# Patient Record
Sex: Male | Born: 1960 | Race: White | Hispanic: No | Smoking: Former smoker
Health system: Southern US, Community
[De-identification: ages and names within clinical notes are randomized; demographics above are authoritative.]

## PROBLEM LIST (undated history)

## (undated) DIAGNOSIS — M1990 Unspecified osteoarthritis, unspecified site: Secondary | ICD-10-CM

## (undated) DIAGNOSIS — T7840XA Allergy, unspecified, initial encounter: Secondary | ICD-10-CM

## (undated) DIAGNOSIS — F419 Anxiety disorder, unspecified: Secondary | ICD-10-CM

## (undated) DIAGNOSIS — K219 Gastro-esophageal reflux disease without esophagitis: Secondary | ICD-10-CM

## (undated) DIAGNOSIS — E785 Hyperlipidemia, unspecified: Secondary | ICD-10-CM

## (undated) DIAGNOSIS — I10 Essential (primary) hypertension: Secondary | ICD-10-CM

## (undated) HISTORY — DX: Allergy, unspecified, initial encounter: T78.40XA

## (undated) HISTORY — DX: Gastro-esophageal reflux disease without esophagitis: K21.9

## (undated) HISTORY — DX: Anxiety disorder, unspecified: F41.9

## (undated) HISTORY — DX: Essential (primary) hypertension: I10

## (undated) HISTORY — PX: OTHER SURGICAL HISTORY: SHX169

## (undated) HISTORY — DX: Unspecified osteoarthritis, unspecified site: M19.90

## (undated) HISTORY — DX: Hyperlipidemia, unspecified: E78.5

## (undated) HISTORY — PX: POLYPECTOMY: SHX149

## (undated) HISTORY — PX: COLONOSCOPY: SHX174

---

## 2000-08-06 ENCOUNTER — Encounter: Admission: RE | Admit: 2000-08-06 | Discharge: 2000-08-17 | Payer: Self-pay | Admitting: Specialist

## 2002-08-25 ENCOUNTER — Emergency Department (HOSPITAL_COMMUNITY): Admission: EM | Admit: 2002-08-25 | Discharge: 2002-08-25 | Payer: Self-pay | Admitting: Emergency Medicine

## 2003-03-21 ENCOUNTER — Emergency Department (HOSPITAL_COMMUNITY): Admission: EM | Admit: 2003-03-21 | Discharge: 2003-03-21 | Payer: Self-pay | Admitting: Emergency Medicine

## 2005-12-01 ENCOUNTER — Encounter: Admission: RE | Admit: 2005-12-01 | Discharge: 2005-12-01 | Payer: Self-pay | Admitting: Neurology

## 2010-08-12 ENCOUNTER — Encounter: Payer: Self-pay | Admitting: Gastroenterology

## 2010-08-20 ENCOUNTER — Ambulatory Visit (AMBULATORY_SURGERY_CENTER): Payer: 59 | Admitting: *Deleted

## 2010-08-20 VITALS — Ht 69.0 in | Wt 180.5 lb

## 2010-08-20 DIAGNOSIS — Z1211 Encounter for screening for malignant neoplasm of colon: Secondary | ICD-10-CM

## 2010-08-20 MED ORDER — PEG-KCL-NACL-NASULF-NA ASC-C 100 G PO SOLR: ORAL | Status: DC

## 2010-08-21 ENCOUNTER — Encounter: Payer: Self-pay | Admitting: Gastroenterology

## 2010-08-28 ENCOUNTER — Telehealth: Payer: Self-pay | Admitting: Gastroenterology

## 2010-08-28 NOTE — Telephone Encounter (Signed)
Rx for Moviprep called in to [facility] on [address].  No ID on answering machine.  No message left. Casey Stephens

## 2010-09-03 ENCOUNTER — Encounter: Payer: Self-pay | Admitting: Gastroenterology

## 2010-09-03 ENCOUNTER — Ambulatory Visit (AMBULATORY_SURGERY_CENTER): Payer: 59 | Admitting: Gastroenterology

## 2010-09-03 DIAGNOSIS — K620 Anal polyp: Secondary | ICD-10-CM

## 2010-09-03 DIAGNOSIS — D126 Benign neoplasm of colon, unspecified: Secondary | ICD-10-CM

## 2010-09-03 DIAGNOSIS — Z1211 Encounter for screening for malignant neoplasm of colon: Secondary | ICD-10-CM

## 2010-09-03 MED ORDER — SODIUM CHLORIDE 0.9 % IV SOLN: 500.0000 mL | INTRAVENOUS | Status: DC

## 2010-09-03 NOTE — Patient Instructions (Addendum)
Please read the handouts given to you by your recovery room nurse.    We will send you a letter within two weeks stating when you need to some back to see [place], and which type of polyp.    Resume your routine medications.   If you have any questions or concerns, please call us at [phone].   Thank-you.

## 2010-09-04 ENCOUNTER — Telehealth: Payer: Self-pay

## 2010-09-04 NOTE — Telephone Encounter (Signed)
No ID on answering machine. 

## 2010-09-14 ENCOUNTER — Encounter: Payer: Self-pay | Admitting: Gastroenterology

## 2012-09-20 ENCOUNTER — Other Ambulatory Visit: Payer: Self-pay | Admitting: Physician Assistant

## 2012-09-29 ENCOUNTER — Other Ambulatory Visit: Payer: BC Managed Care – PPO

## 2012-09-29 DIAGNOSIS — D751 Secondary polycythemia: Secondary | ICD-10-CM

## 2012-09-29 DIAGNOSIS — E291 Testicular hypofunction: Secondary | ICD-10-CM

## 2012-09-29 MED ORDER — TESTOSTERONE CYPIONATE 200 MG/ML IM SOLN: 100.0000 mg | INTRAMUSCULAR | Status: DC

## 2012-09-30 LAB — CBC WITH DIFFERENTIAL/PLATELET
Basophils Relative: 1 % (ref 0–1)
Eosinophils Absolute: 0.2 10*3/uL (ref 0.0–0.7)
Eosinophils Relative: 3 % (ref 0–5)
HCT: 49.6 % (ref 39.0–52.0)
Hemoglobin: 16.9 g/dL (ref 13.0–17.0)
Lymphocytes Relative: 36 % (ref 12–46)
Lymphs Abs: 2.6 10*3/uL (ref 0.7–4.0)
MCH: 31.2 pg (ref 26.0–34.0)
MCHC: 34.1 g/dL (ref 30.0–36.0)
MCV: 91.7 fL (ref 78.0–100.0)
Monocytes Absolute: 0.8 10*3/uL (ref 0.1–1.0)
Monocytes Relative: 11 % (ref 3–12)
Neutro Abs: 3.5 10*3/uL (ref 1.7–7.7)
Platelets: 241 10*3/uL (ref 150–400)
RBC: 5.41 MIL/uL (ref 4.22–5.81)
RDW: 13.7 % (ref 11.5–15.5)
WBC: 7.2 10*3/uL (ref 4.0–10.5)

## 2012-09-30 LAB — PSA: PSA: 0.99 ng/mL (ref <=4.00)

## 2012-10-04 ENCOUNTER — Encounter: Payer: Self-pay | Admitting: Family Medicine

## 2012-10-04 ENCOUNTER — Ambulatory Visit (INDEPENDENT_AMBULATORY_CARE_PROVIDER_SITE_OTHER): Payer: BC Managed Care – PPO | Admitting: Family Medicine

## 2012-10-04 ENCOUNTER — Telehealth: Payer: Self-pay | Admitting: Family Medicine

## 2012-10-04 VITALS — BP 120/88 | HR 78 | Temp 98.1°F | Resp 16 | Wt 180.0 lb

## 2012-10-04 DIAGNOSIS — I770 Arteriovenous fistula, acquired: Secondary | ICD-10-CM

## 2012-10-04 DIAGNOSIS — R519 Headache, unspecified: Secondary | ICD-10-CM

## 2012-10-04 DIAGNOSIS — R51 Headache: Secondary | ICD-10-CM

## 2012-10-04 MED ORDER — EPINEPHRINE 0.3 MG/0.3ML IJ SOAJ: 0.3000 mg | Freq: Once | INTRAMUSCULAR | Status: DC

## 2012-10-04 NOTE — Progress Notes (Signed)
Subjective:    Patient ID: Casey Stephens, male    DOB: 1960/05/07, 52 y.o.   MRN: [id number]  HPI Patient suffered a traumatic injury to his left temple in 1999. He was hit in the head with a softball. 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 He underwent a coil procedure due to chronic headaches that this triggered. The headaches are pulsatile in nature unilateral and associated with nausea. The headache subsided after the procedure. Over the last month he started developing the same headaches again. They're constant and daily. Worse at night. They're pulsatile in nature. They're located over the left temporal artery. He also has some mild blurred vision. He has some nausea. He denies photophobia. He denies photophobia. He denies worsening of the headache with Valsalva. He denies neurologic deficits. Past Medical History  Diagnosis Date  . Allergy    Past Surgical History  Procedure Laterality Date  . Av fistula lt temple      ruptured artery from softball injury  . Crushed thumb repair    . Testicular biopsy      negative   Current Outpatient Prescriptions on File Prior to Visit  Medication Sig Dispense Refill  . aspirin 81 MG tablet Take 81 mg by mouth daily.        . fish oil-omega-3 fatty acids 1000 MG capsule Take 2 g by mouth daily.         No current facility-administered medications on file prior to visit.   No Known Allergies History   Social History  . Marital Status: Married    Spouse Name: N/A    Number of Children: N/A  . Years of Education: N/A   Occupational History  . Not on file.   Social History Main Topics  . Smoking status: Current Every Day Smoker -- 0.50 packs/day for 25 years    Types: Cigarettes  . Smokeless tobacco: Never Used  . Alcohol Use: 1.2 oz/week    2 Cans of beer per week  . Drug Use: No  . Sexually Active: Not on file   Other Topics Concern  . Not on file   Social History Narrative   . No narrative on file   Family history significant for a sister with migraines.   Review of Systems  All other systems reviewed and are negative.       Objective:   Physical Exam  Constitutional: He appears well-developed and well-nourished. No distress.  Eyes: Conjunctivae and EOM are normal. Pupils are equal, round, and reactive to light.  Neck: Neck supple. No JVD present.  Cardiovascular: Normal rate, regular rhythm and normal heart sounds.   Pulmonary/Chest: Effort normal and breath sounds normal. No respiratory distress. He has no wheezes.  Lymphadenopathy:    He has no cervical adenopathy.  Neurological: He is alert. He has normal strength and normal reflexes. He displays no atrophy and no tremor. No cranial nerve deficit or sensory deficit. He exhibits normal muscle tone. He displays a negative Romberg sign. He displays no seizure activity. Coordination and gait normal.  Skin: He is not diaphoretic.          Assessment & Plan:  Unilateral headache - Plan: MR Angiogram Head Wo Contrast  AV fistula - Plan: MR Angiogram Head Wo Contrast  Evaluate previous AV fistula repair with an MRA of the brain. If this shows worsening of the fistula or new fistula the patient may need neurosurgery consultation. However the  patient may be having atypical migraines related to his previous fistula repair. If MRA is negative, I would consider starting patient on Topamax to treat chronic daily migraines.  I believe his age and sex make temporal arteritis unlikely.

## 2012-10-05 NOTE — Telephone Encounter (Signed)
Patient aware of labs results 

## 2012-10-08 ENCOUNTER — Other Ambulatory Visit: Payer: 59

## 2012-10-10 ENCOUNTER — Ambulatory Visit
Admission: RE | Admit: 2012-10-10 | Discharge: 2012-10-10 | Disposition: A | Discharge status: Home / Self Care | Payer: BC Managed Care – PPO | Source: Ambulatory Visit | Attending: Family Medicine | Admitting: Family Medicine

## 2012-10-10 DIAGNOSIS — I770 Arteriovenous fistula, acquired: Secondary | ICD-10-CM

## 2012-10-10 DIAGNOSIS — R519 Headache, unspecified: Secondary | ICD-10-CM

## 2012-10-13 ENCOUNTER — Other Ambulatory Visit: Payer: Self-pay | Admitting: Family Medicine

## 2012-10-13 MED ORDER — SUMATRIPTAN SUCCINATE 50 MG PO TABS: ORAL_TABLET | ORAL | Status: DC

## 2012-10-13 NOTE — Telephone Encounter (Signed)
Med sent to pharmacy.

## 2012-10-27 ENCOUNTER — Ambulatory Visit (INDEPENDENT_AMBULATORY_CARE_PROVIDER_SITE_OTHER): Payer: BC Managed Care – PPO | Admitting: Family Medicine

## 2012-10-27 ENCOUNTER — Encounter: Payer: Self-pay | Admitting: Family Medicine

## 2012-10-27 VITALS — BP 130/80 | HR 78 | Temp 98.4°F | Resp 16 | Wt 183.0 lb

## 2012-10-27 DIAGNOSIS — R519 Headache, unspecified: Secondary | ICD-10-CM

## 2012-10-27 DIAGNOSIS — R51 Headache: Secondary | ICD-10-CM

## 2012-10-27 NOTE — Progress Notes (Signed)
Subjective:    Patient ID: Casey Stephens, male    DOB: Mar 28, 1960, 52 y.o.   MRN: [id number]  HPI  10/04/12 Patient suffered a traumatic injury to his left temple in 1999. He was hit in the head with a softball. 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 He underwent a coil procedure due to chronic headaches that this triggered. The headaches are pulsatile in nature unilateral and associated with nausea. The headache subsided after the procedure. Over the last month he started developing the same headaches again. They're constant and daily. Worse at night. They're pulsatile in nature. They're located over the left temporal artery. He also has some mild blurred vision. He has some nausea. He denies photophobia. He denies photophobia. He denies worsening of the headache with Valsalva. He denies neurologic deficits. Unilateral headache Evaluate previous AV fistula repair with an MRA of the brain. If this shows worsening of the fistula or new fistula the patient may need neurosurgery consultation. However the patient may be having atypical migraines related to his previous fistula repair. If MRA is negative, I would consider starting patient on Topamax to treat chronic daily migraines.  I believe his age and sex make temporal arteritis unlikely.  10/27/12 MRA of the patient's brain was negative. There was no evidence of aneurysm were AV fistula. Prescribed the patient Imitrex to treat migraines and this has helped his headache. Unfortunately he continues to have headaches on a daily basis. They continue to be unilateral over the left side of his head. They continue to be pulsatile and associated with nausea. Past Medical History  Diagnosis Date  . Allergy    Past Surgical History  Procedure Laterality Date  . Av fistula lt temple      ruptured artery from softball injury  . Crushed thumb repair    . Testicular biopsy      negative   Current Outpatient  Prescriptions on File Prior to Visit  Medication Sig Dispense Refill  . aspirin 81 MG tablet Take 81 mg by mouth daily.        Marland Kitchen EPINEPHrine (EPI-PEN) 0.3 mg/0.3 mL SOAJ Inject 0.3 mLs (0.3 mg total) into the muscle once.  1 Device  5  . fish oil-omega-3 fatty acids 1000 MG capsule Take 2 g by mouth daily.        . SUMAtriptan (IMITREX) 50 MG tablet Take 1 tab po at onset of headache and repeat in 2 hours PRN  10 tablet  2   No current facility-administered medications on file prior to visit.   No Known Allergies History   Social History  . Marital Status: Married    Spouse Name: N/A    Number of Children: N/A  . Years of Education: N/A   Occupational History  . Not on file.   Social History Main Topics  . Smoking status: Current Every Day Smoker -- 0.50 packs/day for 25 years    Types: Cigarettes  . Smokeless tobacco: Never Used  . Alcohol Use: 1.2 oz/week    2 Cans of beer per week  . Drug Use: No  . Sexual Activity: Not on file   Other Topics Concern  . Not on file   Social History Narrative  . No narrative on file   Family history significant for a sister with migraines.   Review of Systems  All other systems reviewed and are negative.       Objective:  Physical Exam  Constitutional: He appears well-developed and well-nourished. No distress.  Eyes: Conjunctivae and EOM are normal. Pupils are equal, round, and reactive to light.  Neck: Neck supple. No JVD present.  Cardiovascular: Normal rate, regular rhythm and normal heart sounds.   Pulmonary/Chest: Effort normal and breath sounds normal. No respiratory distress. He has no wheezes.  Lymphadenopathy:    He has no cervical adenopathy.  Neurological: He is alert. He has normal strength and normal reflexes. He displays no atrophy and no tremor. No cranial nerve deficit or sensory deficit. He exhibits normal muscle tone. He displays a negative Romberg sign. He displays no seizure activity. Coordination and gait  normal.  Skin: He is not diaphoretic.          Assessment & Plan:  Unilateral headache  I believe the patient's headaches are likely migraines although they're atypical in nature. I spent 15 minutes discussing treatment options with the patient. Given the fact they're occurring on a daily basis, I recommended Topamax 25 mg by mouth each bedtime. I recommended increasing medication in  25 mg increments each week until he is taking a total of 100 mg per day. I would then recheck him in one month. The patient would like to consider this first prior to starting medication. He stated he would call me with his decision in the next few days.

## 2012-12-05 ENCOUNTER — Telehealth: Payer: Self-pay | Admitting: Family Medicine

## 2012-12-05 MED ORDER — TESTOSTERONE CYPIONATE 200 MG/ML IM SOLN: 100.0000 mg | INTRAMUSCULAR | Status: DC

## 2012-12-05 NOTE — Telephone Encounter (Signed)
Testosterone Cyp 2000 mg/10 mL inject 1/2 mL q2 weeks as directed

## 2012-12-05 NOTE — Telephone Encounter (Signed)
rx was printed and faxed to pharmacy 

## 2013-10-01 ENCOUNTER — Other Ambulatory Visit: Payer: Self-pay | Admitting: Family Medicine

## 2013-10-02 NOTE — Telephone Encounter (Signed)
ok 

## 2013-10-02 NOTE — Telephone Encounter (Signed)
Ok to refill??  Last office visit 10/27/2012.  Last refill 12/05/2012.

## 2013-10-02 NOTE — Telephone Encounter (Signed)
Prescription faxed

## 2013-10-16 ENCOUNTER — Other Ambulatory Visit: Payer: Self-pay | Admitting: Physician Assistant

## 2013-10-17 NOTE — Telephone Encounter (Signed)
Refill appropriate and filled per protocol. 

## 2013-12-19 ENCOUNTER — Other Ambulatory Visit: Payer: 59

## 2013-12-19 DIAGNOSIS — Z0000 Encounter for general adult medical examination without abnormal findings: Secondary | ICD-10-CM

## 2013-12-19 LAB — COMPLETE METABOLIC PANEL WITH GFR
ALT: 30 U/L (ref 0–53)
AST: 19 U/L (ref 0–37)
Albumin: 4.3 g/dL (ref 3.5–5.2)
Alkaline Phosphatase: 57 U/L (ref 39–117)
BUN: 12 mg/dL (ref 6–23)
CO2: 23 mEq/L (ref 19–32)
Calcium: 9.5 mg/dL (ref 8.4–10.5)
Chloride: 107 mEq/L (ref 96–112)
Creat: 1.09 mg/dL (ref 0.50–1.35)
GFR, Est African American: 89 mL/min
GFR, Est Non African American: 77 mL/min
Glucose, Bld: 82 mg/dL (ref 70–99)
Potassium: 4.1 mEq/L (ref 3.5–5.3)
Sodium: 139 mEq/L (ref 135–145)
Total Bilirubin: 1.1 mg/dL (ref 0.2–1.2)
Total Protein: 6.6 g/dL (ref 6.0–8.3)

## 2013-12-19 LAB — CBC WITH DIFFERENTIAL/PLATELET
Basophils Absolute: 0.1 10*3/uL (ref 0.0–0.1)
Basophils Relative: 1 % (ref 0–1)
Eosinophils Absolute: 0.2 10*3/uL (ref 0.0–0.7)
Eosinophils Relative: 2 % (ref 0–5)
HCT: 47.3 % (ref 39.0–52.0)
Hemoglobin: 16.4 g/dL (ref 13.0–17.0)
Lymphocytes Relative: 35 % (ref 12–46)
Lymphs Abs: 2.7 10*3/uL (ref 0.7–4.0)
MCH: 30.4 pg (ref 26.0–34.0)
MCHC: 34.7 g/dL (ref 30.0–36.0)
MCV: 87.6 fL (ref 78.0–100.0)
Monocytes Absolute: 0.9 10*3/uL (ref 0.1–1.0)
Monocytes Relative: 12 % (ref 3–12)
Neutro Abs: 3.8 10*3/uL (ref 1.7–7.7)
Neutrophils Relative %: 50 % (ref 43–77)
Platelets: 213 10*3/uL (ref 150–400)
RBC: 5.4 MIL/uL (ref 4.22–5.81)
RDW: 13.5 % (ref 11.5–15.5)
WBC: 7.6 10*3/uL (ref 4.0–10.5)

## 2013-12-19 LAB — LIPID PANEL
Cholesterol: 154 mg/dL (ref 0–200)
HDL: 46 mg/dL (ref >39)
LDL Cholesterol: 73 mg/dL (ref 0–99)
Total CHOL/HDL Ratio: 3.3 Ratio
Triglycerides: 174 mg/dL — ABNORMAL HIGH (ref <150)
VLDL: 35 mg/dL (ref 0–40)

## 2013-12-26 ENCOUNTER — Ambulatory Visit
Admission: RE | Admit: 2013-12-26 | Discharge: 2013-12-26 | Disposition: A | Discharge status: Home / Self Care | Payer: 59 | Source: Ambulatory Visit | Attending: Family Medicine | Admitting: Family Medicine

## 2013-12-26 ENCOUNTER — Encounter: Payer: Self-pay | Admitting: Family Medicine

## 2013-12-26 ENCOUNTER — Ambulatory Visit (INDEPENDENT_AMBULATORY_CARE_PROVIDER_SITE_OTHER): Payer: 59 | Admitting: Family Medicine

## 2013-12-26 VITALS — BP 122/80 | HR 78 | Temp 97.8°F | Resp 18 | Ht 69.0 in | Wt 197.0 lb

## 2013-12-26 DIAGNOSIS — Z23 Encounter for immunization: Secondary | ICD-10-CM

## 2013-12-26 DIAGNOSIS — M5136 Other intervertebral disc degeneration, lumbar region: Secondary | ICD-10-CM

## 2013-12-26 DIAGNOSIS — Z0000 Encounter for general adult medical examination without abnormal findings: Secondary | ICD-10-CM

## 2013-12-26 DIAGNOSIS — Z125 Encounter for screening for malignant neoplasm of prostate: Secondary | ICD-10-CM

## 2013-12-26 NOTE — Addendum Note (Signed)
Addended by: Jenna Luo on: 12/26/2013 12:22 PM   Modules accepted: Orders

## 2013-12-26 NOTE — Addendum Note (Signed)
Addended by: Shary Decamp B on: 12/26/2013 02:29 PM   Modules accepted: Orders

## 2013-12-26 NOTE — Progress Notes (Signed)
Subjective:    Patient ID: Casey Stephens, male    DOB: [birth year], 53 y.o.   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 Recently complains of increasing left-sided lower back pain. He denies any lumbar radiculopathy. He denies any numbness or tingling in his legs or feet. He denies any symptoms of cauda equina syndrome. He does report crepitus in his back with range of motion. He has constant dull daily pain which is aggravated by prolonged standing or walking. He's tried ibuprofen with minimal relief. He has also tried heat andTENS unit with minimal relief. Patient's colonoscopy was performed 2 years ago and is up to date. He is due for rectal exam and PSA. He is also due for his flu shot. His most recent lab work is listed below: Lab on 12/19/2013  Component Date Value Ref Range Status  . Cholesterol 12/19/2013 154  0 - 200 mg/dL Final   Comment: ATP III Classification:                                < 200        mg/dL        Desirable                               200 - 239     mg/dL        Borderline High                               >= 240        mg/dL        High                             . Triglycerides 12/19/2013 174* <150 mg/dL Final  . HDL 12/19/2013 46  >39 mg/dL Final  . Total CHOL/HDL Ratio 12/19/2013 3.3   Final  . VLDL 12/19/2013 35  0 - 40 mg/dL Final  . LDL Cholesterol 12/19/2013 73  0 - 99 mg/dL Final   Comment:                            Total Cholesterol/HDL Ratio:CHD Risk                                                 Coronary Heart Disease Risk Table                                                                 Men       Women                                   1/2 Average Risk  3.4        3.3                                       Average Risk              5.0        4.4                                    2X Average Risk              9.6        7.1                                    3X Average Risk              23.4       11.0                          Use the calculated Patient Ratio above and the CHD Risk table                           to determine the patient's CHD Risk.                          ATP III Classification (LDL):                                < 100        mg/dL         Optimal                               100 - 129     mg/dL         Near or Above Optimal                               130 - 159     mg/dL         Borderline High                               160 - 189     mg/dL         High                                > 190        mg/dL         Very High                             . WBC 12/19/2013 7.6  4.0 - 10.5 K/uL Final  . RBC 12/19/2013 5.40  4.22 - 5.81 MIL/uL Final  . Hemoglobin 12/19/2013 16.4  13.0 - 17.0 g/dL Final  . HCT 12/19/2013 47.3  39.0 -  52.0 % Final  . MCV 12/19/2013 87.6  78.0 - 100.0 fL Final  . MCH 12/19/2013 30.4  26.0 - 34.0 pg Final  . MCHC 12/19/2013 34.7  30.0 - 36.0 g/dL Final  . RDW 12/19/2013 13.5  11.5 - 15.5 % Final  . Platelets 12/19/2013 213  150 - 400 K/uL Final  . Neutrophils Relative % 12/19/2013 50  43 - 77 % Final  . Neutro Abs 12/19/2013 3.8  1.7 - 7.7 K/uL Final  . Lymphocytes Relative 12/19/2013 35  12 - 46 % Final  . Lymphs Abs 12/19/2013 2.7  0.7 - 4.0 K/uL Final  . Monocytes Relative 12/19/2013 12  3 - 12 % Final  . Monocytes Absolute 12/19/2013 0.9  0.1 - 1.0 K/uL Final  . Eosinophils Relative 12/19/2013 2  0 - 5 % Final  . Eosinophils Absolute 12/19/2013 0.2  0.0 - 0.7 K/uL Final  . Basophils Relative 12/19/2013 1  0 - 1 % Final  . Basophils Absolute 12/19/2013 0.1  0.0 - 0.1 K/uL Final  . Smear Review 12/19/2013 Criteria for review not met   Final  . Sodium 12/19/2013 139  135 - 145 mEq/L Final  . Potassium 12/19/2013 4.1  3.5 - 5.3 mEq/L Final  . Chloride 12/19/2013 107  96 - 112 mEq/L Final  . CO2 12/19/2013 23  19 - 32 mEq/L Final  . Glucose, Bld 12/19/2013 82  70 - 99 mg/dL Final  . BUN 12/19/2013 12  6 -  23 mg/dL Final  . Creat 12/19/2013 1.09  0.50 - 1.35 mg/dL Final  . Total Bilirubin 12/19/2013 1.1  0.2 - 1.2 mg/dL Final  . Alkaline Phosphatase 12/19/2013 57  39 - 117 U/L Final  . AST 12/19/2013 19  0 - 37 U/L Final  . ALT 12/19/2013 30  0 - 53 U/L Final  . Total Protein 12/19/2013 6.6  6.0 - 8.3 g/dL Final  . Albumin 12/19/2013 4.3  3.5 - 5.2 g/dL Final  . Calcium 12/19/2013 9.5  8.4 - 10.5 mg/dL Final  . GFR, Est African American 12/19/2013 89   Final  . GFR, Est Non African American 12/19/2013 77   Final   Comment:                            The estimated GFR is a calculation valid for adults (>=49 years old)                          that uses the CKD-EPI algorithm to adjust for age and sex. It is                            not to be used for children, pregnant women, hospitalized patients,                             patients on dialysis, or with rapidly changing kidney function.                          According to the NKDEP, eGFR >89 is normal, 60-89 shows mild                          impairment, 30-59 shows moderate impairment, 15-29 shows severe  impairment and <15 is ESRD.                              Past Medical History  Diagnosis Date  . Allergy    Past Surgical History  Procedure Laterality Date  . Av fistula lt temple      ruptured artery from softball injury  . Crushed thumb repair    . Testicular biopsy      negative   Current Outpatient Prescriptions on File Prior to Visit  Medication Sig Dispense Refill  . aspirin 81 MG tablet Take 81 mg by mouth daily.        . B-D 3CC LUER-LOK SYR 23GX1" 23G X 1" 3 ML MISC USE TO INJECT TESTOSTERONE EVERY 2 WEEKS  6 each  3  . EPINEPHrine (EPI-PEN) 0.3 mg/0.3 mL SOAJ Inject 0.3 mLs (0.3 mg total) into the muscle once.  1 Device  5  . fish oil-omega-3 fatty acids 1000 MG capsule Take 2 g by mouth daily.        . SUMAtriptan (IMITREX) 50 MG tablet Take 1 tab po at onset of headache and repeat in 2  hours PRN  10 tablet  2  . testosterone cypionate (DEPOTESTOTERONE CYPIONATE) 200 MG/ML injection INJECT 1/2ML INTO MUSCLE EVERY 2 WEEKS AS DIRECTED  10 mL  0   No current facility-administered medications on file prior to visit.   No Known Allergies History   Social History  . Marital Status: Married    Spouse Name: N/A    Number of Children: N/A  . Years of Education: N/A   Occupational History  . Not on file.   Social History Main Topics  . Smoking status: Current Every Day Smoker -- 0.50 packs/day for 25 years    Types: Cigarettes  . Smokeless tobacco: Never Used  . Alcohol Use: 1.2 oz/week    2 Cans of beer per week  . Drug Use: No  . Sexual Activity: Not on file   Other Topics Concern  . Not on file   Social History Narrative  . No narrative on file   Family History  Problem Relation Age of Onset  . Diabetes Father       Review of Systems  All other systems reviewed and are negative.      Objective:   Physical Exam  Vitals reviewed. Constitutional: He is oriented to person, place, and time. He appears well-developed and well-nourished. No distress.  HENT:  Head: Normocephalic and atraumatic.  Right Ear: External ear normal.  Left Ear: External ear normal.  Nose: Nose normal.  Mouth/Throat: Oropharynx is clear and moist. No oropharyngeal exudate.  Eyes: Conjunctivae and EOM are normal. Pupils are equal, round, and reactive to light. Right eye exhibits no discharge. Left eye exhibits no discharge. No scleral icterus.  Neck: Normal range of motion. Neck supple. No JVD present. No tracheal deviation present. No thyromegaly present.  Cardiovascular: Normal rate, regular rhythm, normal heart sounds and intact distal pulses.  Exam reveals no gallop and no friction rub.   No murmur heard. Pulmonary/Chest: Effort normal and breath sounds normal. No stridor. No respiratory distress. He has no wheezes. He has no rales. He exhibits no tenderness.  Abdominal: Soft.  Bowel sounds are normal. He exhibits no distension and no mass. There is no tenderness. There is no rebound and no guarding.  Genitourinary: Rectum normal and prostate normal.  Musculoskeletal: Normal range of motion.  He exhibits no edema and no tenderness.  Lymphadenopathy:    He has no cervical adenopathy.  Neurological: He is alert and oriented to person, place, and time. He has normal reflexes. He displays normal reflexes. No cranial nerve deficit. He exhibits normal muscle tone. Coordination normal.  Skin: Skin is warm. No rash noted. He is not diaphoretic. No erythema. No pallor.  Psychiatric: He has a normal mood and affect. His behavior is normal. Judgment and thought content normal.          Assessment & Plan:  Degenerative disc disease, lumbar - Plan: DG Lumbar Spine Complete  Special screening for malignant neoplasm of prostate  Routine general medical examination at a health care facility  Patient's physical exam is completely normal. His lab work is excellent. His cancer screening is up to date. His prostate exam is normal. I will add a PSA to his lab work. His blood pressure is excellent. 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 I will obtain an x-ray of the lumbar spine to evaluate further. I would likely recommend meloxicam or diclofenac as well as physical therapy for his low back pain is no better I would recommend referral back to his orthopedist Dr. Louanne Skye.

## 2013-12-27 ENCOUNTER — Other Ambulatory Visit: Payer: Self-pay | Admitting: Family Medicine

## 2013-12-27 DIAGNOSIS — M549 Dorsalgia, unspecified: Principal | ICD-10-CM

## 2013-12-27 DIAGNOSIS — G8929 Other chronic pain: Secondary | ICD-10-CM

## 2013-12-27 LAB — PSA: PSA: 0.77 ng/mL (ref <=4.00)

## 2013-12-27 MED ORDER — DICLOFENAC SODIUM 75 MG PO TBEC: 75.0000 mg | DELAYED_RELEASE_TABLET | Freq: Two times a day (BID) | ORAL | Status: DC

## 2014-01-02 ENCOUNTER — Ambulatory Visit: Payer: 59 | Attending: Family Medicine | Admitting: Physical Therapy

## 2014-01-02 DIAGNOSIS — Z5189 Encounter for other specified aftercare: Secondary | ICD-10-CM | POA: Insufficient documentation

## 2014-01-02 DIAGNOSIS — M5136 Other intervertebral disc degeneration, lumbar region: Secondary | ICD-10-CM | POA: Diagnosis not present

## 2014-01-02 DIAGNOSIS — M549 Dorsalgia, unspecified: Secondary | ICD-10-CM | POA: Diagnosis not present

## 2014-01-29 ENCOUNTER — Ambulatory Visit (INDEPENDENT_AMBULATORY_CARE_PROVIDER_SITE_OTHER): Payer: 59 | Admitting: Family Medicine

## 2014-01-29 ENCOUNTER — Encounter: Payer: Self-pay | Admitting: Family Medicine

## 2014-01-29 VITALS — BP 120/74 | HR 68 | Temp 98.2°F | Resp 16 | Ht 69.0 in | Wt 194.0 lb

## 2014-01-29 DIAGNOSIS — M5136 Other intervertebral disc degeneration, lumbar region: Secondary | ICD-10-CM

## 2014-01-29 MED ORDER — DICLOFENAC SODIUM 75 MG PO TBEC: 75.0000 mg | DELAYED_RELEASE_TABLET | Freq: Two times a day (BID) | ORAL | Status: DC

## 2014-01-29 NOTE — Progress Notes (Signed)
   Subjective:    Patient ID: Casey Stephens, male    DOB: October 21, 1960, 53 y.o.   MRN: [id number]  HPI Please see the patient's last office visit. At that time, the patient was complaining about back pain. I obtained an x-ray of his lumbar spine which revealed degenerative disc disease at L4-L5, and L5-S1. The patient also has facet disease at L3-S1. I started the patient on diclofenac 75 mg by mouth twice a day. His pain has improved over 90%. He is doing much better. He denies any symptoms of cauda equina syndrome or lumbar radiculopathy. Past Medical History  Diagnosis Date  . Allergy    Past Surgical History  Procedure Laterality Date  . Av fistula lt temple      ruptured artery from softball injury  . Crushed thumb repair    . Testicular biopsy      negative   Current Outpatient Prescriptions on File Prior to Visit  Medication Sig Dispense Refill  . aspirin 81 MG tablet Take 81 mg by mouth daily.      . B-D 3CC LUER-LOK SYR 23GX1" 23G X 1" 3 ML MISC USE TO INJECT TESTOSTERONE EVERY 2 WEEKS 6 each 3  . EPINEPHrine (EPI-PEN) 0.3 mg/0.3 mL SOAJ Inject 0.3 mLs (0.3 mg total) into the muscle once. 1 Device 5  . fish oil-omega-3 fatty acids 1000 MG capsule Take 2 g by mouth daily.      . SUMAtriptan (IMITREX) 50 MG tablet Take 1 tab po at onset of headache and repeat in 2 hours PRN 10 tablet 2  . testosterone cypionate (DEPOTESTOTERONE CYPIONATE) 200 MG/ML injection INJECT 1/2ML INTO MUSCLE EVERY 2 WEEKS AS DIRECTED 10 mL 0   No current facility-administered medications on file prior to visit.   No Known Allergies History   Social History  . Marital Status: Married    Spouse Name: N/A    Number of Children: N/A  . Years of Education: N/A   Occupational History  . Not on file.   Social History Main Topics  . Smoking status: Former Smoker -- 0.50 packs/day for 25 years    Types: Cigarettes    Quit date: 01/29/2013  . Smokeless tobacco: Never Used  . Alcohol Use: 1.2 oz/week     2 Cans of beer per week  . Drug Use: No  . Sexual Activity: Not on file   Other Topics Concern  . Not on file   Social History Narrative      Review of Systems  All other systems reviewed and are negative.      Objective:   Physical Exam  Cardiovascular: Normal rate and regular rhythm.   Pulmonary/Chest: Effort normal and breath sounds normal.  Musculoskeletal: Normal range of motion. He exhibits no tenderness.  Vitals reviewed.         Assessment & Plan:  DDD (degenerative disc disease), lumbar  Patient's pain in his lower back is due to a combination of degenerative disc disease as well as facet arthritis. I recommended that he stop taking diclofenac now the symptoms are better. I warned the patient about potential GI toxicity after prolonged use of NSAIDs. I recommended that he try to use the medication sparingly as needed for symptom control. If necessary to take everyday, I would recommend switching the patient to Celebrex.

## 2014-05-28 ENCOUNTER — Encounter: Payer: Self-pay | Admitting: Family Medicine

## 2014-05-28 ENCOUNTER — Ambulatory Visit (INDEPENDENT_AMBULATORY_CARE_PROVIDER_SITE_OTHER): Payer: 59 | Admitting: Family Medicine

## 2014-05-28 VITALS — BP 136/90 | HR 84 | Temp 97.7°F | Resp 18 | Ht 69.0 in | Wt 191.0 lb

## 2014-05-28 DIAGNOSIS — J309 Allergic rhinitis, unspecified: Secondary | ICD-10-CM | POA: Diagnosis not present

## 2014-05-28 MED ORDER — FLUTICASONE PROPIONATE 50 MCG/ACT NA SUSP: 2.0000 | Freq: Every day | NASAL | Status: DC

## 2014-05-28 MED ORDER — METHYLPREDNISOLONE ACETATE 40 MG/ML IJ SUSP
60.0000 mg | Freq: Once | INTRAMUSCULAR | Status: AC
  Administered 2014-05-28: 60 mg via INTRAMUSCULAR

## 2014-05-28 NOTE — Addendum Note (Signed)
Addended by: Shary Decamp B on: 05/28/2014 12:56 PM   Modules accepted: Orders

## 2014-05-28 NOTE — Progress Notes (Signed)
   Subjective:    Patient ID: Casey Stephens, male    DOB: 1960/09/13, 54 y.o.   MRN: [id number]  HPI Patient presents with one-week history of itchy watery eyes, rhinorrhea, head congestion, sinus pressure, and slight cough. He denies any fevers or chills. He denies any sinus headache. He denies any vision changes. He denies any shortness of breath. The cough is nonproductive. Past Medical History  Diagnosis Date  . Allergy    Past Surgical History  Procedure Laterality Date  . Av fistula lt temple      ruptured artery from softball injury  . Crushed thumb repair    . Testicular biopsy      negative   Current Outpatient Prescriptions on File Prior to Visit  Medication Sig Dispense Refill  . aspirin 81 MG tablet Take 81 mg by mouth daily.      . B-D 3CC LUER-LOK SYR 23GX1" 23G X 1" 3 ML MISC USE TO INJECT TESTOSTERONE EVERY 2 WEEKS 6 each 3  . diclofenac (VOLTAREN) 75 MG EC tablet Take 1 tablet (75 mg total) by mouth 2 (two) times daily. 60 tablet 2  . EPINEPHrine (EPI-PEN) 0.3 mg/0.3 mL SOAJ Inject 0.3 mLs (0.3 mg total) into the muscle once. 1 Device 5  . fish oil-omega-3 fatty acids 1000 MG capsule Take 2 g by mouth daily.      . SUMAtriptan (IMITREX) 50 MG tablet Take 1 tab po at onset of headache and repeat in 2 hours PRN 10 tablet 2  . testosterone cypionate (DEPOTESTOTERONE CYPIONATE) 200 MG/ML injection INJECT 1/2ML INTO MUSCLE EVERY 2 WEEKS AS DIRECTED 10 mL 0   No current facility-administered medications on file prior to visit.   No Known Allergies History   Social History  . Marital Status: Married    Spouse Name: N/A  . Number of Children: N/A  . Years of Education: N/A   Occupational History  . Not on file.   Social History Main Topics  . Smoking status: Former Smoker -- 0.50 packs/day for 25 years    Types: Cigarettes    Quit date: 01/29/2013  . Smokeless tobacco: Never Used  . Alcohol Use: 1.2 oz/week    2 Cans of beer per week  . Drug Use: No  .  Sexual Activity: Not on file   Other Topics Concern  . Not on file   Social History Narrative      Review of Systems  All other systems reviewed and are negative.      Objective:   Physical Exam  Constitutional: He appears well-developed and well-nourished.  HENT:  Right Ear: Tympanic membrane, external ear and ear canal normal.  Left Ear: Tympanic membrane, external ear and ear canal normal.  Nose: Mucosal edema and rhinorrhea present. Right sinus exhibits no maxillary sinus tenderness and no frontal sinus tenderness. Left sinus exhibits no maxillary sinus tenderness and no frontal sinus tenderness.  Mouth/Throat: Oropharynx is clear and moist. No oropharyngeal exudate.  Vitals reviewed.  patient has bilateral allergic conjunctivitis.        Assessment & Plan:  Allergic sinusitis - Plan: fluticasone (FLONASE) 50 MCG/ACT nasal spray  I believe the patient has severe seasonal allergies. He is already on Allegra. I recommended he add Flonase 2 sprays each nostril daily. I will also treat the patient with Depo-Medrol 60 mg IM 1 to help calm down his allergies until the Flonase can take affect

## 2014-05-30 ENCOUNTER — Telehealth: Payer: Self-pay | Admitting: Family Medicine

## 2014-05-30 NOTE — Telephone Encounter (Signed)
[phone] PT saw you earlier this week and his wife has called saying he is not any better and is wanting to know if there could be something else done? I made an apt for 05/31/14 but the wife is wanting to know if something could be done without him having to come back in and pay his copay since he was just here for this issue.

## 2014-05-31 ENCOUNTER — Ambulatory Visit: Payer: 59 | Admitting: Family Medicine

## 2014-05-31 MED ORDER — AMOXICILLIN-POT CLAVULANATE 875-125 MG PO TABS: 1.0000 | ORAL_TABLET | Freq: Two times a day (BID) | ORAL | Status: DC

## 2014-05-31 NOTE — Telephone Encounter (Signed)
Called and spoke with wife.  Aware of Rx for Augmentin, cont nasal spray.  We will cancel appt for today but will need to see if not better after this course of treatment

## 2014-05-31 NOTE — Telephone Encounter (Signed)
Add augmentin 875 bid for 10 days and continue flonase.

## 2014-08-10 ENCOUNTER — Ambulatory Visit (INDEPENDENT_AMBULATORY_CARE_PROVIDER_SITE_OTHER): Payer: 59 | Admitting: Family Medicine

## 2014-08-10 ENCOUNTER — Encounter: Payer: Self-pay | Admitting: Family Medicine

## 2014-08-10 VITALS — BP 134/80 | HR 82 | Temp 98.0°F | Resp 18 | Wt 192.0 lb

## 2014-08-10 DIAGNOSIS — M545 Low back pain, unspecified: Secondary | ICD-10-CM

## 2014-08-10 MED ORDER — CYCLOBENZAPRINE HCL 10 MG PO TABS: 10.0000 mg | ORAL_TABLET | Freq: Three times a day (TID) | ORAL | Status: DC | PRN

## 2014-08-10 MED ORDER — PREDNISONE 20 MG PO TABS: ORAL_TABLET | ORAL | Status: DC

## 2014-08-10 NOTE — Progress Notes (Signed)
Subjective:    Patient ID: Casey Stephens, male    DOB: 04-Nov-1960, 54 y.o.   MRN: [id number]  HPI Tuesday, the patient bent over to work on a computer and work on wires underneath his desk. Shortly thereafter, he developed sharp pain in his lower back in the right and left paraspinal muscles at the level of L3-L4 and L5. He is extremely tender to palpation in that area. He has palpable muscle spasms right greater than left. He denies any sciatica. He denies any symptoms of cauda equina syndrome. He denies any leg weakness or leg numbness. He denies any dysuria or hematuria. He denies any difficulty going to the bathroom. He is tried diclofenac with no benefit. Patient had an x-ray of his back taken in October 2015 which revealed facet disease at L3-S1 but no degenerative disc disease. Past Medical History  Diagnosis Date  . Allergy    Past Surgical History  Procedure Laterality Date  . Av fistula lt temple      ruptured artery from softball injury  . Crushed thumb repair    . Testicular biopsy      negative   Current Outpatient Prescriptions on File Prior to Visit  Medication Sig Dispense Refill  . aspirin 81 MG tablet Take 81 mg by mouth daily.      . B-D 3CC LUER-LOK SYR 23GX1" 23G X 1" 3 ML MISC USE TO INJECT TESTOSTERONE EVERY 2 WEEKS 6 each 3  . diclofenac (VOLTAREN) 75 MG EC tablet Take 1 tablet (75 mg total) by mouth 2 (two) times daily. 60 tablet 2  . EPINEPHrine (EPI-PEN) 0.3 mg/0.3 mL SOAJ Inject 0.3 mLs (0.3 mg total) into the muscle once. 1 Device 5  . fish oil-omega-3 fatty acids 1000 MG capsule Take 2 g by mouth daily.      . fluticasone (FLONASE) 50 MCG/ACT nasal spray Place 2 sprays into both nostrils daily. 16 g 6  . testosterone cypionate (DEPOTESTOTERONE CYPIONATE) 200 MG/ML injection INJECT 1/2ML INTO MUSCLE EVERY 2 WEEKS AS DIRECTED 10 mL 0   No current facility-administered medications on file prior to visit.   No Known Allergies History   Social History    . Marital Status: Married    Spouse Name: N/A  . Number of Children: N/A  . Years of Education: N/A   Occupational History  . Not on file.   Social History Main Topics  . Smoking status: Former Smoker -- 0.50 packs/day for 25 years    Types: Cigarettes    Quit date: 01/29/2013  . Smokeless tobacco: Never Used  . Alcohol Use: 1.2 oz/week    2 Cans of beer per week  . Drug Use: No  . Sexual Activity: Not on file   Other Topics Concern  . Not on file   Social History Narrative      Review of Systems  All other systems reviewed and are negative.      Objective:   Physical Exam  Cardiovascular: Normal rate, regular rhythm and normal heart sounds.   Pulmonary/Chest: Effort normal and breath sounds normal. No respiratory distress. He has no wheezes. He has no rales.  Musculoskeletal:       Lumbar back: He exhibits decreased range of motion, tenderness, pain and spasm. He exhibits no bony tenderness.  Vitals reviewed.         Assessment & Plan:  Midline low back pain without sciatica - Plan: predniSONE (DELTASONE) 20 MG tablet, cyclobenzaprine (FLEXERIL) 10 MG tablet  I suspect muscle strain in the lumbar paraspinal muscles. Try prednisone taper pack as an anti-inflammatory discontinue diclofenac. Use Flexeril 10 mg every 8 hours as needed for muscle spasms. Recheck next week.

## 2014-08-13 ENCOUNTER — Telehealth: Payer: Self-pay | Admitting: Family Medicine

## 2014-08-13 NOTE — Telephone Encounter (Signed)
Patient's wife gretchen calling back regarding visit with dr pickard on Friday, saying that back is not better and would like to get scheduled for mri if possible  [phone]

## 2014-08-14 ENCOUNTER — Other Ambulatory Visit: Payer: Self-pay | Admitting: Family Medicine

## 2014-08-14 DIAGNOSIS — M545 Low back pain, unspecified: Secondary | ICD-10-CM

## 2014-08-14 NOTE — Telephone Encounter (Signed)
I have ordered MRI

## 2014-08-14 NOTE — Telephone Encounter (Signed)
Pt's wife aware and message sent to [facility] to see if we can get this done asap

## 2014-08-15 ENCOUNTER — Ambulatory Visit
Admission: RE | Admit: 2014-08-15 | Discharge: 2014-08-15 | Disposition: A | Discharge status: Home / Self Care | Payer: 59 | Source: Ambulatory Visit | Attending: Family Medicine | Admitting: Family Medicine

## 2014-08-15 DIAGNOSIS — M545 Low back pain, unspecified: Secondary | ICD-10-CM

## 2014-08-16 ENCOUNTER — Encounter: Payer: Self-pay | Admitting: Family Medicine

## 2014-08-16 ENCOUNTER — Other Ambulatory Visit: Payer: Self-pay | Admitting: Family Medicine

## 2014-08-16 DIAGNOSIS — G950 Syringomyelia and syringobulbia: Secondary | ICD-10-CM

## 2014-08-16 DIAGNOSIS — G8929 Other chronic pain: Secondary | ICD-10-CM

## 2014-08-16 DIAGNOSIS — M549 Dorsalgia, unspecified: Principal | ICD-10-CM

## 2014-08-20 ENCOUNTER — Other Ambulatory Visit: Payer: Self-pay

## 2014-08-20 ENCOUNTER — Ambulatory Visit
Admission: RE | Admit: 2014-08-20 | Discharge: 2014-08-20 | Disposition: A | Discharge status: Home / Self Care | Payer: 59 | Source: Ambulatory Visit | Attending: Family Medicine | Admitting: Family Medicine

## 2014-08-20 DIAGNOSIS — G950 Syringomyelia and syringobulbia: Secondary | ICD-10-CM

## 2014-08-20 MED ORDER — GADOBENATE DIMEGLUMINE 529 MG/ML IV SOLN
17.0000 mL | Freq: Once | INTRAVENOUS | Status: AC | PRN
  Administered 2014-08-20: 17 mL via INTRAVENOUS

## 2014-08-24 ENCOUNTER — Telehealth: Payer: Self-pay | Admitting: Family Medicine

## 2014-08-24 NOTE — Telephone Encounter (Signed)
Patient is calling to get mri results  [phone]

## 2014-08-24 NOTE — Telephone Encounter (Signed)
Patient aware of results.

## 2014-08-27 ENCOUNTER — Ambulatory Visit
Admission: RE | Admit: 2014-08-27 | Discharge: 2014-08-27 | Disposition: A | Discharge status: Home / Self Care | Payer: 59 | Source: Ambulatory Visit | Attending: Family Medicine | Admitting: Family Medicine

## 2014-08-27 ENCOUNTER — Other Ambulatory Visit: Payer: 59

## 2014-08-27 ENCOUNTER — Inpatient Hospital Stay: Admission: RE | Admit: 2014-08-27 | Payer: 59 | Source: Ambulatory Visit

## 2014-08-27 DIAGNOSIS — G950 Syringomyelia and syringobulbia: Secondary | ICD-10-CM

## 2014-08-27 MED ORDER — GADOBENATE DIMEGLUMINE 529 MG/ML IV SOLN
17.0000 mL | Freq: Once | INTRAVENOUS | Status: AC | PRN
  Administered 2014-08-27: 17 mL via INTRAVENOUS

## 2014-08-30 ENCOUNTER — Encounter: Payer: Self-pay | Admitting: Family Medicine

## 2014-08-30 ENCOUNTER — Ambulatory Visit: Payer: 59 | Admitting: Family Medicine

## 2014-08-30 ENCOUNTER — Ambulatory Visit (INDEPENDENT_AMBULATORY_CARE_PROVIDER_SITE_OTHER): Payer: 59 | Admitting: Family Medicine

## 2014-08-30 DIAGNOSIS — M545 Low back pain, unspecified: Secondary | ICD-10-CM

## 2014-08-30 DIAGNOSIS — R1903 Right lower quadrant abdominal swelling, mass and lump: Secondary | ICD-10-CM | POA: Diagnosis not present

## 2014-08-30 DIAGNOSIS — R938 Abnormal findings on diagnostic imaging of other specified body structures: Secondary | ICD-10-CM | POA: Diagnosis not present

## 2014-08-30 DIAGNOSIS — G950 Syringomyelia and syringobulbia: Secondary | ICD-10-CM

## 2014-08-30 DIAGNOSIS — R9389 Abnormal findings on diagnostic imaging of other specified body structures: Secondary | ICD-10-CM

## 2014-08-30 NOTE — Progress Notes (Signed)
Subjective:    Patient ID: Casey Stephens, male    DOB: [birth year], 54 y.o.   MRN: [id number]  HPI 08/10/14 Tuesday, the patient bent over to work on a computer and work on wires underneath his desk. Shortly thereafter, he developed sharp pain in his lower back in the right and left paraspinal muscles at the level of L3-L4 and L5. He is extremely tender to palpation in that area. He has palpable muscle spasms right greater than left. He denies any sciatica. He denies any symptoms of cauda equina syndrome. He denies any leg weakness or leg numbness. He denies any dysuria or hematuria. He denies any difficulty going to the bathroom. He is tried diclofenac with no benefit. Patient had an x-ray of his back taken in October 2015 which revealed facet disease at L3-S1 but no degenerative disc disease.  At that time, my plan was: I suspect muscle strain in the lumbar paraspinal muscles. Try prednisone taper pack as an anti-inflammatory discontinue diclofenac. Use Flexeril 10 mg every 8 hours as needed for muscle spasms. Recheck next week. L-spine MRI 1. 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 2. A lower cord syrinx is noted. Recommend MRI cervical and thoracic lumbar spine without and with contrast to evaluate the rest of the spinal cord. 3. 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 This is most significant at L3-4, L4-5 and L5-S1 as specifically discussed above. 4. Shallow broad-based right paracentral and foraminal/extra foraminal disc protrusion at L2-3 potentially irritating both the right L2 and L3 nerve roots.  Tspine MRI: On the single sagittal sequence through the cervical spine obtained for proper level assignment, cervical spondylotic changes are noted C3-4 thru C6-7 although incompletely assessed on present exam. No Chiari 1 malformation.  Tiny thoracic syrinx maximal at the upper T6 level and T12 level measuring 2 mm in  maximal transverse dimension. No surrounding abnormal enhancement.  Scattered mild degenerative changes throughout the thoracic spine as detailed above. This includes moderate right T11-12 foraminal narrowing.  Structure compressing the inferior vena cava may represent an anatomical variant of the liver however this is incompletely assessed on present examination and contrast-enhanced abdominal CT to exclude the possibility of mass or adenopathy recommend.  Cspine MRI: 1. Abnormal gray matter track T2 signal hyperintensity in a bilaterally symmetric manner at C5 and the C5-6 level extending over a 1.2 cm vertical excursion. This especially involves the posterior horns. Posterior horn involvement can be implicated in the setting of vitamin B12 deficiency or copper deficiency, however both of these are normally associated with a much longer segment of involvement. Accordingly, while checking serum B12 and copper levels is probably warranted, the abnormal gliosis/edema in the gray matter at this level may be related to an old insult. 2. Cervical spondylosis and degenerative disc disease cause prominent impingement at all levels between C3 and C7, and mild impingement at the C7-T1 level, as detailed above.  08/30/14 Patient is here today to discuss. He continues to complain of pain in his lower back approximately the level of L2 L3 L4 and L5. The pain does not radiate into his legs. He denies any weakness in his legs or numbness in his legs. He denies any pain in thoracic spine. He is interested in physical therapy Past Medical History  Diagnosis Date  . Allergy    Past Surgical History  Procedure Laterality Date  . Av fistula lt temple      ruptured artery from softball injury  .  Crushed thumb repair    . Testicular biopsy      negative   Current Outpatient Prescriptions on File Prior to Visit  Medication Sig Dispense Refill  . aspirin 81 MG tablet Take 81 mg by mouth daily.       . B-D 3CC LUER-LOK SYR 23GX1" 23G X 1" 3 ML MISC USE TO INJECT TESTOSTERONE EVERY 2 WEEKS 6 each 3  . cyclobenzaprine (FLEXERIL) 10 MG tablet Take 1 tablet (10 mg total) by mouth 3 (three) times daily as needed for muscle spasms. 30 tablet 0  . diclofenac (VOLTAREN) 75 MG EC tablet Take 1 tablet (75 mg total) by mouth 2 (two) times daily. 60 tablet 2  . EPINEPHrine (EPI-PEN) 0.3 mg/0.3 mL SOAJ Inject 0.3 mLs (0.3 mg total) into the muscle once. 1 Device 5  . fish oil-omega-3 fatty acids 1000 MG capsule Take 2 g by mouth daily.      . fluticasone (FLONASE) 50 MCG/ACT nasal spray Place 2 sprays into both nostrils daily. 16 g 6  . predniSONE (DELTASONE) 20 MG tablet 3 tabs poqday 1-2, 2 tabs poqday 3-4, 1 tab poqday 5-6 12 tablet 0  . testosterone cypionate (DEPOTESTOTERONE CYPIONATE) 200 MG/ML injection INJECT 1/2ML INTO MUSCLE EVERY 2 WEEKS AS DIRECTED 10 mL 0   No current facility-administered medications on file prior to visit.   No Known Allergies History   Social History  . Marital Status: Married    Spouse Name: N/A  . Number of Children: N/A  . Years of Education: N/A   Occupational History  . Not on file.   Social History Main Topics  . Smoking status: Former Smoker -- 0.50 packs/day for 25 years    Types: Cigarettes    Quit date: 01/29/2013  . Smokeless tobacco: Never Used  . Alcohol Use: 1.2 oz/week    2 Cans of beer per week  . Drug Use: No  . Sexual Activity: Not on file   Other Topics Concern  . Not on file   Social History Narrative      Review of Systems  All other systems reviewed and are negative.      Objective:   Physical Exam  Cardiovascular: Normal rate, regular rhythm and normal heart sounds.   Pulmonary/Chest: Effort normal and breath sounds normal. No respiratory distress. He has no wheezes. He has no rales.  Musculoskeletal:       Lumbar back: He exhibits decreased range of motion, tenderness, pain and spasm. He exhibits no bony  tenderness.  Vitals reviewed.         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 I will refer the patient to physical therapy. If the patient's back pain is not improving with physical therapy, I will consult a neurosurgeon for possible epidural sterile injection and a second opinion. I explained to the patient that I believe the syringomyelia is purely a coincidental finding and is not the cause of his pain. I will check a B12 copper level to evaluate for causes of abnormality seen on his C-spine MRI. I will also schedule the patient for a CT scan of the abdomen and pelvis to evaluate abnormality seen on MRI compressing his inferior  vena cava

## 2014-08-31 LAB — VITAMIN B12: Vitamin B-12: 456 pg/mL (ref 211–911)

## 2014-09-02 LAB — COPPER, SERUM: COPPER: 82 ug/dL (ref 70–175)

## 2014-09-03 ENCOUNTER — Other Ambulatory Visit: Payer: 59

## 2014-09-03 ENCOUNTER — Telehealth: Payer: Self-pay | Admitting: *Deleted

## 2014-09-03 ENCOUNTER — Telehealth: Payer: Self-pay | Admitting: Family Medicine

## 2014-09-03 NOTE — Telephone Encounter (Signed)
Submitted referral thru [facility] compass with a referral to [facility] Dr. 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

## 2014-09-03 NOTE — Telephone Encounter (Signed)
[facility] from [facility] called and left a message Friday late afternoon that pt would need a Insurance [employer] for his apt.

## 2014-09-03 NOTE — Telephone Encounter (Signed)
[facility]

## 2014-09-04 ENCOUNTER — Encounter: Payer: Self-pay | Admitting: Family Medicine

## 2014-09-04 ENCOUNTER — Ambulatory Visit: Payer: 59

## 2014-09-04 ENCOUNTER — Ambulatory Visit
Admission: RE | Admit: 2014-09-04 | Discharge: 2014-09-04 | Disposition: A | Discharge status: Home / Self Care | Payer: 59 | Source: Ambulatory Visit | Attending: Family Medicine | Admitting: Family Medicine

## 2014-09-04 DIAGNOSIS — R1903 Right lower quadrant abdominal swelling, mass and lump: Secondary | ICD-10-CM

## 2014-09-04 DIAGNOSIS — R9389 Abnormal findings on diagnostic imaging of other specified body structures: Secondary | ICD-10-CM

## 2014-09-04 MED ORDER — IOPAMIDOL (ISOVUE-300) INJECTION 61%
100.0000 mL | Freq: Once | INTRAVENOUS | Status: AC | PRN
  Administered 2014-09-04: 100 mL via INTRAVENOUS

## 2014-09-04 NOTE — Telephone Encounter (Signed)
Compass referral placed and faxed to provider

## 2014-09-18 ENCOUNTER — Ambulatory Visit: Payer: 59 | Admitting: Physical Therapy

## 2014-10-09 ENCOUNTER — Ambulatory Visit: Payer: 59

## 2015-01-04 ENCOUNTER — Other Ambulatory Visit: Payer: Self-pay | Admitting: Family Medicine

## 2015-01-04 MED ORDER — TESTOSTERONE CYPIONATE 200 MG/ML IM SOLN: INTRAMUSCULAR | Status: DC

## 2015-01-04 NOTE — Telephone Encounter (Signed)
[place]   PATIENT HAS CHANGED PHARMACIES AND NEEDS TESTOSTERONE CALLED INTO THIS PHARMACY

## 2015-01-04 NOTE — Telephone Encounter (Signed)
Prescription faxed

## 2015-01-04 NOTE — Telephone Encounter (Signed)
Ok to refill 

## 2015-01-04 NOTE — Telephone Encounter (Signed)
ok 

## 2015-02-08 ENCOUNTER — Other Ambulatory Visit: Payer: 59

## 2015-02-08 ENCOUNTER — Other Ambulatory Visit: Payer: Self-pay | Admitting: Family Medicine

## 2015-02-08 DIAGNOSIS — Z0000 Encounter for general adult medical examination without abnormal findings: Secondary | ICD-10-CM

## 2015-02-08 DIAGNOSIS — Z79890 Hormone replacement therapy (postmenopausal): Secondary | ICD-10-CM

## 2015-02-08 DIAGNOSIS — E291 Testicular hypofunction: Secondary | ICD-10-CM

## 2015-02-08 DIAGNOSIS — Z79899 Other long term (current) drug therapy: Secondary | ICD-10-CM

## 2015-02-08 LAB — COMPLETE METABOLIC PANEL WITH GFR
ALT: 31 U/L (ref 9–46)
AST: 19 U/L (ref 10–35)
Albumin: 4.5 g/dL (ref 3.6–5.1)
Alkaline Phosphatase: 65 U/L (ref 40–115)
BUN: 12 mg/dL (ref 7–25)
CALCIUM: 9.1 mg/dL (ref 8.6–10.3)
CHLORIDE: 108 mmol/L (ref 98–110)
CO2: 20 mmol/L (ref 20–31)
Creat: 0.98 mg/dL (ref 0.70–1.33)
GFR, Est African American: >89 mL/min (ref >=60)
GFR, Est Non African American: 87 mL/min (ref >=60)
Glucose, Bld: 88 mg/dL (ref 70–99)
POTASSIUM: 4.2 mmol/L (ref 3.5–5.3)
SODIUM: 139 mmol/L (ref 135–146)
Total Bilirubin: 0.6 mg/dL (ref 0.2–1.2)
Total Protein: 6.9 g/dL (ref 6.1–8.1)

## 2015-02-08 LAB — CBC WITH DIFFERENTIAL/PLATELET
Basophils Absolute: 0.1 10*3/uL (ref 0.0–0.1)
Basophils Relative: 1 % (ref 0–1)
EOS ABS: 0.3 10*3/uL (ref 0.0–0.7)
EOS PCT: 4 % (ref 0–5)
HEMATOCRIT: 48.6 % (ref 39.0–52.0)
Hemoglobin: 16.6 g/dL (ref 13.0–17.0)
LYMPHS ABS: 3.1 10*3/uL (ref 0.7–4.0)
LYMPHS PCT: 36 % (ref 12–46)
MCH: 30.4 pg (ref 26.0–34.0)
MCHC: 34.2 g/dL (ref 30.0–36.0)
MCV: 89 fL (ref 78.0–100.0)
MONO ABS: 0.9 10*3/uL (ref 0.1–1.0)
MPV: 10.7 fL (ref 8.6–12.4)
Monocytes Relative: 10 % (ref 3–12)
Neutro Abs: 4.2 10*3/uL (ref 1.7–7.7)
Neutrophils Relative %: 49 % (ref 43–77)
Platelets: 248 10*3/uL (ref 150–400)
RBC: 5.46 MIL/uL (ref 4.22–5.81)
RDW: 13.2 % (ref 11.5–15.5)
WBC: 8.5 10*3/uL (ref 4.0–10.5)

## 2015-02-08 LAB — LIPID PANEL
CHOL/HDL RATIO: 3.4 ratio (ref <=5.0)
CHOLESTEROL: 148 mg/dL (ref 125–200)
HDL: 44 mg/dL (ref >=40)
LDL Cholesterol: 69 mg/dL (ref <130)
Triglycerides: 176 mg/dL — ABNORMAL HIGH (ref <150)
VLDL: 35 mg/dL — AB (ref <30)

## 2015-02-08 LAB — TSH: TSH: 1.177 u[IU]/mL (ref 0.350–4.500)

## 2015-02-09 LAB — PSA: PSA: 0.87 ng/mL (ref <=4.00)

## 2015-02-09 LAB — TESTOSTERONE: Testosterone: 337 ng/dL (ref 300–890)

## 2015-02-11 ENCOUNTER — Ambulatory Visit (INDEPENDENT_AMBULATORY_CARE_PROVIDER_SITE_OTHER): Payer: 59 | Admitting: Family Medicine

## 2015-02-11 ENCOUNTER — Encounter: Payer: Self-pay | Admitting: Family Medicine

## 2015-02-11 VITALS — BP 130/90 | HR 80 | Temp 98.6°F | Resp 18 | Ht 69.0 in | Wt 198.0 lb

## 2015-02-11 DIAGNOSIS — Z0000 Encounter for general adult medical examination without abnormal findings: Secondary | ICD-10-CM | POA: Diagnosis not present

## 2015-02-11 DIAGNOSIS — M545 Low back pain, unspecified: Secondary | ICD-10-CM

## 2015-02-11 DIAGNOSIS — G8929 Other chronic pain: Secondary | ICD-10-CM | POA: Diagnosis not present

## 2015-02-11 MED ORDER — DULOXETINE HCL 60 MG PO CPEP
60.0000 mg | ORAL_CAPSULE | Freq: Every day | ORAL | Status: DC
Start: 2015-02-11 — End: 2015-08-02

## 2015-02-11 NOTE — Progress Notes (Signed)
Subjective:    Patient ID: Casey Stephens, male    DOB: 1960-03-10, 54 y.o.   MRN: [id number]  HPI 08/10/14 Tuesday, the patient bent over to work on a computer and work on wires underneath his desk. Shortly thereafter, he developed sharp pain in his lower back in the right and left paraspinal muscles at the level of L3-L4 and L5. He is extremely tender to palpation in that area. He has palpable muscle spasms right greater than left. He denies any sciatica. He denies any symptoms of cauda equina syndrome. He denies any leg weakness or leg numbness. He denies any dysuria or hematuria. He denies any difficulty going to the bathroom. He is tried diclofenac with no benefit. Patient had an x-ray of his back taken in October 2015 which revealed facet disease at L3-S1 but no degenerative disc disease.  At that time, my plan was: I suspect muscle strain in the lumbar paraspinal muscles. Try prednisone taper pack as an anti-inflammatory discontinue diclofenac. Use Flexeril 10 mg every 8 hours as needed for muscle spasms. Recheck next week. L-spine MRI 1. 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 2. A lower cord syrinx is noted. Recommend MRI cervical and thoracic lumbar spine without and with contrast to evaluate the rest of the spinal cord. 3. 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 This is most significant at L3-4, L4-5 and L5-S1 as specifically discussed above. 4. Shallow broad-based right paracentral and foraminal/extra foraminal disc protrusion at L2-3 potentially irritating both the right L2 and L3 nerve roots.  Tspine MRI: On the single sagittal sequence through the cervical spine obtained for proper level assignment, cervical spondylotic changes are noted C3-4 thru C6-7 although incompletely assessed on present exam. No Chiari 1 malformation.  Tiny thoracic syrinx maximal at the upper T6 level and T12 level measuring 2 mm in  maximal transverse dimension. No surrounding abnormal enhancement.  Scattered mild degenerative changes throughout the thoracic spine as detailed above. This includes moderate right T11-12 foraminal narrowing.  Structure compressing the inferior vena cava may represent an anatomical variant of the liver however this is incompletely assessed on present examination and contrast-enhanced abdominal CT to exclude the possibility of mass or adenopathy recommend.  Cspine MRI: 1. Abnormal gray matter track T2 signal hyperintensity in a bilaterally symmetric manner at C5 and the C5-6 level extending over a 1.2 cm vertical excursion. This especially involves the posterior horns. Posterior horn involvement can be implicated in the setting of vitamin B12 deficiency or copper deficiency, however both of these are normally associated with a much longer segment of involvement. Accordingly, while checking serum B12 and copper levels is probably warranted, the abnormal gliosis/edema in the gray matter at this level may be related to an old insult. 2. Cervical spondylosis and degenerative disc disease cause prominent impingement at all levels between C3 and C7, and mild impingement at the C7-T1 level, as detailed above.  08/30/14 Patient is here today to discuss. He continues to complain of pain in his lower back approximately the level of L2 L3 L4 and L5. The pain does not radiate into his legs. He denies any weakness in his legs or numbness in his legs. He denies any pain in thoracic spine. He is interested in physical therapy.  At that time, my plan was: I believe the majority of the patient's pain is due to the spondylosis in his lumbar spine coupled with a bulging disc at L2-L3. I will refer the  patient to physical therapy. If the patient's back pain is not improving with physical therapy, I will consult a neurosurgeon for possible epidural steroid injection and a second opinion. 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 I will check a B12 copper level to evaluate for causes of abnormality seen on his C-spine MRI. I will also schedule the patient for a CT scan of the abdomen and pelvis to evaluate abnormality seen on MRI compressing his inferior vena cava.  02/11/15 CT revealed no mass compressing the IVC.  Here for CPE.  Last colonoscopy was 2012 and is due again in 2017 due to a polyp. Due for prostate exam.  Has had his flu shot.  Recent labs are listed below.  Still complains of low back pain.  Also reports generalized fatigue and low desire and lack of energy which he attributes to the constant pain.   He denies any symptoms of OSA.  B12 and Hgb are nml.  He denies depression.  His testosterone is low normal.   Past Medical History  Diagnosis Date  . Allergy    Past Surgical History  Procedure Laterality Date  . Av fistula lt temple      ruptured artery from softball injury  . Crushed thumb repair    . Testicular biopsy      negative   Current Outpatient Prescriptions on File Prior to Visit  Medication Sig Dispense Refill  . aspirin 81 MG tablet Take 81 mg by mouth daily.      . B-D 3CC LUER-LOK SYR 23GX1" 23G X 1" 3 ML MISC USE TO INJECT TESTOSTERONE EVERY 2 WEEKS 6 each 3  . cyclobenzaprine (FLEXERIL) 10 MG tablet Take 1 tablet (10 mg total) by mouth 3 (three) times daily as needed for muscle spasms. 30 tablet 0  . diclofenac (VOLTAREN) 75 MG EC tablet Take 1 tablet (75 mg total) by mouth 2 (two) times daily. 60 tablet 2  . EPINEPHrine (EPI-PEN) 0.3 mg/0.3 mL SOAJ Inject 0.3 mLs (0.3 mg total) into the muscle once. 1 Device 5  . fish oil-omega-3 fatty acids 1000 MG capsule Take 2 g by mouth daily.      . fluticasone (FLONASE) 50 MCG/ACT nasal spray Place 2 sprays into both nostrils daily. 16 g 6  . predniSONE (DELTASONE) 20 MG tablet 3 tabs poqday 1-2, 2 tabs poqday 3-4, 1 tab poqday 5-6 12  tablet 0  . testosterone cypionate (DEPOTESTOSTERONE CYPIONATE) 200 MG/ML injection INJECT 1/2ML INTO MUSCLE EVERY 2 WEEKS AS DIRECTED 10 mL 0   No current facility-administered medications on file prior to visit.   No Known Allergies Social History   Social History  . Marital Status: Married    Spouse Name: N/A  . Number of Children: N/A  . Years of Education: N/A   Occupational History  . Not on file.   Social History Main Topics  . Smoking status: Former Smoker -- 0.50 packs/day for 25 years    Types: Cigarettes    Quit date: 01/29/2013  . Smokeless tobacco: Never Used  . Alcohol Use: 1.2 oz/week    2 Cans of beer per week  . Drug Use: No  . Sexual Activity: Not on file   Other Topics Concern  . Not on file   Social History Narrative   Family History  Problem Relation Age of Onset  . Diabetes Father  Review of Systems  All other systems reviewed and are negative.      Objective:   Physical Exam  Constitutional: He is oriented to person, place, and time. He appears well-developed and well-nourished. No distress.  HENT:  Head: Normocephalic and atraumatic.  Right Ear: External ear normal.  Left Ear: External ear normal.  Nose: Nose normal.  Mouth/Throat: Oropharynx is clear and moist. No oropharyngeal exudate.  Eyes: Conjunctivae and EOM are normal. Pupils are equal, round, and reactive to light. Right eye exhibits no discharge. Left eye exhibits no discharge. No scleral icterus.  Neck: Normal range of motion. Neck supple. No JVD present. No tracheal deviation present. No thyromegaly present.  Cardiovascular: Normal rate, regular rhythm, normal heart sounds and intact distal pulses.  Exam reveals no gallop and no friction rub.   No murmur heard. Pulmonary/Chest: Effort normal and breath sounds normal. No stridor. No respiratory distress. He has no wheezes. He has no rales.  Abdominal: Soft. Bowel sounds are normal. He exhibits no distension and no mass.  There is no tenderness. There is no rebound and no guarding.  Genitourinary: Prostate normal and penis normal.  Musculoskeletal: He exhibits tenderness. He exhibits no edema.       Lumbar back: He exhibits decreased range of motion, tenderness, pain and spasm. He exhibits no bony tenderness.  Lymphadenopathy:    He has no cervical adenopathy.  Neurological: He is alert and oriented to person, place, and time. He has normal reflexes. He displays normal reflexes. No cranial nerve deficit. He exhibits normal muscle tone. Coordination normal.  Skin: Skin is warm. No rash noted. He is not diaphoretic. No erythema. No pallor.  Psychiatric: He has a normal mood and affect. His behavior is normal. Judgment and thought content normal.  Vitals reviewed.         Assessment & Plan:  Chronic low back pain - Plan: DULoxetine (CYMBALTA) 60 MG capsule  Routine general medical examination at a health care facility  CPE is normal, labs are excellent.  Immunizations are up to date.  Trial of cymbalta 60 mg poqday for LBP.  If no better in 1 month, trial of gabapentin vs increasing testosterone dose.

## 2015-03-19 ENCOUNTER — Telehealth: Payer: Self-pay | Admitting: *Deleted

## 2015-03-19 NOTE — Telephone Encounter (Signed)
Received request from pharmacy for PA on testosterone.   PA submitted.   Dx: E29.1- hypogonadism

## 2015-03-20 NOTE — Telephone Encounter (Signed)
Testosterone has been approved through 03/19/15 - 03/08/2038 -  Pharmacy aware via vm. Pt aware via mychart.

## 2015-07-04 ENCOUNTER — Encounter: Payer: Self-pay | Admitting: Internal Medicine

## 2015-07-09 ENCOUNTER — Encounter: Payer: Self-pay | Admitting: Internal Medicine

## 2015-07-25 ENCOUNTER — Encounter: Payer: Self-pay | Admitting: Family Medicine

## 2015-07-25 ENCOUNTER — Ambulatory Visit (INDEPENDENT_AMBULATORY_CARE_PROVIDER_SITE_OTHER): Payer: BLUE CROSS/BLUE SHIELD | Admitting: Family Medicine

## 2015-07-25 VITALS — BP 142/90 | HR 60 | Temp 98.1°F | Resp 18 | Wt 196.0 lb

## 2015-07-25 DIAGNOSIS — M545 Low back pain, unspecified: Secondary | ICD-10-CM

## 2015-07-25 DIAGNOSIS — M47816 Spondylosis without myelopathy or radiculopathy, lumbar region: Secondary | ICD-10-CM | POA: Diagnosis not present

## 2015-07-25 DIAGNOSIS — G8929 Other chronic pain: Secondary | ICD-10-CM

## 2015-07-25 MED ORDER — HYDROCODONE-ACETAMINOPHEN 5-325 MG PO TABS: 1.0000 | ORAL_TABLET | Freq: Four times a day (QID) | ORAL | Status: DC | PRN

## 2015-07-25 MED ORDER — DIAZEPAM 10 MG PO TABS: 10.0000 mg | ORAL_TABLET | Freq: Three times a day (TID) | ORAL | Status: DC | PRN

## 2015-07-25 NOTE — Progress Notes (Signed)
Subjective:    Patient ID: Casey Stephens, male    DOB: [birth year], 55 y.o.   MRN: [id number]  HPI 08/10/14 Tuesday, the patient bent over to work on a computer and work on wires underneath his desk. Shortly thereafter, he developed sharp pain in his lower back in the right and left paraspinal muscles at the level of L3-L4 and L5. He is extremely tender to palpation in that area. He has palpable muscle spasms right greater than left. He denies any sciatica. He denies any symptoms of cauda equina syndrome. He denies any leg weakness or leg numbness. He denies any dysuria or hematuria. He denies any difficulty going to the bathroom. He is tried diclofenac with no benefit. Patient had an x-ray of his back taken in October 2015 which revealed facet disease at L3-S1 but no degenerative disc disease.  At that time, my plan was: I suspect muscle strain in the lumbar paraspinal muscles. Try prednisone taper pack as an anti-inflammatory discontinue diclofenac. Use Flexeril 10 mg every 8 hours as needed for muscle spasms. Recheck next week. L-spine MRI 1. 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 2. A lower cord syrinx is noted. Recommend MRI cervical and thoracic lumbar spine without and with contrast to evaluate the rest of the spinal cord. 3. 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 This is most significant at L3-4, L4-5 and L5-S1 as specifically discussed above. 4. Shallow broad-based right paracentral and foraminal/extra foraminal disc protrusion at L2-3 potentially irritating both the right L2 and L3 nerve roots.  Tspine MRI: On the single sagittal sequence through the cervical spine obtained for proper level assignment, cervical spondylotic changes are noted C3-4 thru C6-7 although incompletely assessed on present exam. No Chiari 1 malformation.  Tiny thoracic syrinx maximal at the upper T6 level and T12 level measuring 2 mm in  maximal transverse dimension. No surrounding abnormal enhancement.  Scattered mild degenerative changes throughout the thoracic spine as detailed above. This includes moderate right T11-12 foraminal narrowing.  Structure compressing the inferior vena cava may represent an anatomical variant of the liver however this is incompletely assessed on present examination and contrast-enhanced abdominal CT to exclude the possibility of mass or adenopathy recommend.  Cspine MRI: 1. Abnormal gray matter track T2 signal hyperintensity in a bilaterally symmetric manner at C5 and the C5-6 level extending over a 1.2 cm vertical excursion. This especially involves the posterior horns. Posterior horn involvement can be implicated in the setting of vitamin B12 deficiency or copper deficiency, however both of these are normally associated with a much longer segment of involvement. Accordingly, while checking serum B12 and copper levels is probably warranted, the abnormal gliosis/edema in the gray matter at this level may be related to an old insult. 2. Cervical spondylosis and degenerative disc disease cause prominent impingement at all levels between C3 and C7, and mild impingement at the C7-T1 level, as detailed above.  08/30/14 Patient is here today to discuss. He continues to complain of pain in his lower back approximately the level of L2 L3 L4 and L5. The pain does not radiate into his legs. He denies any weakness in his legs or numbness in his legs. He denies any pain in thoracic spine. He is interested in physical therapy.  At that time, my plan was: I believe the majority of the patient's pain is due to the spondylosis in his lumbar spine coupled with a bulging disc at L2-L3. I will refer the  patient to physical therapy. If the patient's back pain is not improving with physical therapy, I will consult a neurosurgeon for possible epidural steroid injection and a second opinion. 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 I will check a B12 copper level to evaluate for causes of abnormality seen on his C-spine MRI. I will also schedule the patient for a CT scan of the abdomen and pelvis to evaluate abnormality seen on MRI compressing his inferior vena cava.  02/11/15 CT revealed no mass compressing the IVC.  Here for CPE.  Last colonoscopy was 2012 and is due again in 2017 due to a polyp. Due for prostate exam.  Has had his flu shot.  Still complains of low back pain.  Also reports generalized fatigue and low desire and lack of energy which he attributes to the constant pain.   He denies any symptoms of OSA.  B12 and Hgb are nml.  He denies depression.  His testosterone is low normal.  At that time, my plan was: CPE is normal, labs are excellent.  Immunizations are up to date.  Trial of cymbalta 60 mg poqday for LBP.  If no better in 1 month, trial of gabapentin vs increasing testosterone dose.  07/25/15 The pain in his lower back is steadily worsening. He states he experiences pain around the level of L3-L4 to L5-S1 on a daily basis. The pain is constant and dull. It times it can be sharp and debilitating. On top of that, he gets occasional spasms and muscle pain that caused him to miss work. He denies any sciatica or radicular symptoms. The pain seems to stay centered in his lower back. The MRI of his lumbar spine performed in June 2016 revealed degenerative disc disease and lumbar spondylosis most prominent at L3-L4 and L4-L5. At those levels he had facet arthritis bilaterally along with bulging disc. He also had reactive endplate changes at [id number]-GRM. There was no nerve impingement but this does seem to be the source of the majority of his pain. At that time he saw Casey Stephens who recommended against surgery or epidural steroid injections. Instead he gave him Valium and core exercises to strengthen his core muscles. The  Valium helps when he takes it but he does not want to take medicine on a daily basis. If the pain has worsened, he is interested in possible more aggressive options for his low back pain. He also reports occasional anxiety stemming from his job at work which has a difficult time sleeping Past Medical History  Diagnosis Date  . Allergy    Past Surgical History  Procedure Laterality Date  . Av fistula lt temple      ruptured artery from softball injury  . Crushed thumb repair    . Testicular biopsy      negative   Current Outpatient Prescriptions on File Prior to Visit  Medication Sig Dispense Refill  . aspirin 81 MG tablet Take 81 mg by mouth daily.      . B-D 3CC LUER-LOK SYR 23GX1" 23G X 1" 3 ML MISC USE TO INJECT TESTOSTERONE EVERY 2 WEEKS 6 each 3  . cyclobenzaprine (FLEXERIL) 10 MG tablet Take 1 tablet (10 mg total) by mouth 3 (three) times daily as needed for muscle spasms. 30 tablet 0  . diclofenac (VOLTAREN) 75 MG EC tablet Take 1 tablet (75 mg total) by mouth 2 (two) times daily.  60 tablet 2  . DULoxetine (CYMBALTA) 60 MG capsule Take 1 capsule (60 mg total) by mouth daily. 60 capsule 3  . EPINEPHrine (EPI-PEN) 0.3 mg/0.3 mL SOAJ Inject 0.3 mLs (0.3 mg total) into the muscle once. 1 Device 5  . fish oil-omega-3 fatty acids 1000 MG capsule Take 2 g by mouth daily.      . fluticasone (FLONASE) 50 MCG/ACT nasal spray Place 2 sprays into both nostrils daily. 16 g 6  . testosterone cypionate (DEPOTESTOSTERONE CYPIONATE) 200 MG/ML injection INJECT 1/2ML INTO MUSCLE EVERY 2 WEEKS AS DIRECTED 10 mL 0   No current facility-administered medications on file prior to visit.   No Known Allergies Social History   Social History  . Marital Status: Married    Spouse Name: N/A  . Number of Children: N/A  . Years of Education: N/A   Occupational History  . Not on file.   Social History Main Topics  . Smoking status: Former Smoker -- 0.50 packs/day for 25 years    Types: Cigarettes     Quit date: 01/29/2013  . Smokeless tobacco: Never Used  . Alcohol Use: 1.2 oz/week    2 Cans of beer per week  . Drug Use: No  . Sexual Activity: Not on file   Other Topics Concern  . Not on file   Social History Narrative   Family History  Problem Relation Age of Onset  . Diabetes Father       Review of Systems  All other systems reviewed and are negative.      Objective:   Physical Exam  Constitutional: He is oriented to person, place, and time. He appears well-developed and well-nourished. No distress.  HENT:  Head: Normocephalic and atraumatic.  Right Ear: External ear normal.  Left Ear: External ear normal.  Nose: Nose normal.  Mouth/Throat: Oropharynx is clear and moist. No oropharyngeal exudate.  Eyes: Conjunctivae and EOM are normal. Pupils are equal, round, and reactive to light. Right eye exhibits no discharge. Left eye exhibits no discharge. No scleral icterus.  Neck: Normal range of motion. Neck supple. No JVD present. No tracheal deviation present. No thyromegaly present.  Cardiovascular: Normal rate, regular rhythm, normal heart sounds and intact distal pulses.  Exam reveals no gallop and no friction rub.   No murmur heard. Pulmonary/Chest: Effort normal and breath sounds normal. No stridor. No respiratory distress. He has no wheezes. He has no rales.  Abdominal: Soft. Bowel sounds are normal. He exhibits no distension and no mass. There is no tenderness. There is no rebound and no guarding.  Genitourinary: Prostate normal and penis normal.  Musculoskeletal: He exhibits tenderness. He exhibits no edema.       Lumbar back: He exhibits decreased range of motion, tenderness, pain and spasm. He exhibits no bony tenderness.  Lymphadenopathy:    He has no cervical adenopathy.  Neurological: He is alert and oriented to person, place, and time. He has normal reflexes. No cranial nerve deficit. He exhibits normal muscle tone. Coordination normal.  Skin: Skin is warm.  No rash noted. He is not diaphoretic. No erythema. No pallor.  Psychiatric: He has a normal mood and affect. His behavior is normal. Judgment and thought content normal.  Vitals reviewed.         Assessment & Plan:  Midline low back pain without sciatica - Plan: HYDROcodone-acetaminophen (NORCO) 5-325 MG tablet, diazepam (VALIUM) 10 MG tablet, Ambulatory referral to Orthopedic Surgery  Chronic low back pain - Plan: Ambulatory referral  to Orthopedic Surgery  Lumbar spondylosis, unspecified spinal osteoarthritis - Plan: Ambulatory referral to Orthopedic Surgery  I will give the patient would benefit from epidural steroid injections and/or facet injections at L3-L4 and L4-L5. Patient is certainly willing to get a second opinion. I will consult Dr. Rolena Infante and Dr. Nelva Bush at [facility] orth[facility]ics to get a second opinion on if they believe epidural steroid injections would benefit the patient. Meanwhile I'll give the patient prescription for Norco 5/325. He can take 1-2 tablets every 6 hours as needed. He will seldomly uses the medication but he would like to have it on hand just in case. I will also refill the Valium that he uses occasionally for muscle spasms. He can also use this medication as needed for anxiety. We discussed the addictive potential of both medications and the patient will use them sparingly as needed. Also recommended him starting a daily anti-inflammatory such as ibuprofen. Consider gabapentin if epidural steroid injections are recommended against.

## 2015-07-29 ENCOUNTER — Other Ambulatory Visit: Payer: Self-pay | Admitting: Family Medicine

## 2015-07-29 MED ORDER — TESTOSTERONE CYPIONATE 200 MG/ML IM SOLN: INTRAMUSCULAR | Status: DC

## 2015-08-02 ENCOUNTER — Ambulatory Visit (AMBULATORY_SURGERY_CENTER): Payer: Self-pay

## 2015-08-02 VITALS — Ht 69.0 in | Wt 195.4 lb

## 2015-08-02 DIAGNOSIS — Z860100 Personal history of colon polyps, unspecified: Secondary | ICD-10-CM

## 2015-08-02 MED ORDER — SUPREP BOWEL PREP KIT 17.5-3.13-1.6 GM/177ML PO SOLN: 1.0000 | Freq: Once | ORAL | Status: DC

## 2015-08-02 NOTE — Progress Notes (Signed)
No allergies to eggs or soy No past problems with anesthesia No diet meds No home oxygen  Has email and internet; declined emmi 

## 2015-08-06 ENCOUNTER — Encounter: Payer: Self-pay | Admitting: Internal Medicine

## 2015-08-12 ENCOUNTER — Telehealth: Payer: Self-pay | Admitting: Internal Medicine

## 2015-08-12 ENCOUNTER — Telehealth: Payer: Self-pay | Admitting: Pediatric Endocrinology

## 2015-08-12 NOTE — Telephone Encounter (Signed)
error 

## 2015-08-12 NOTE — Telephone Encounter (Signed)
Spoke with patient. Told patient that I will call pharmacy and they should be able to pick the prep up today. Then called pharmacy explained that we do not do preauthorization to please fill the suprep. She states she will contact the patient because without insurance coverage the suprep is $106.

## 2015-08-13 DIAGNOSIS — M545 Low back pain: Secondary | ICD-10-CM | POA: Diagnosis not present

## 2015-08-15 ENCOUNTER — Ambulatory Visit (AMBULATORY_SURGERY_CENTER): Payer: BLUE CROSS/BLUE SHIELD | Admitting: Internal Medicine

## 2015-08-15 ENCOUNTER — Encounter: Payer: Self-pay | Admitting: Internal Medicine

## 2015-08-15 VITALS — BP 126/80 | HR 67 | Temp 98.4°F | Resp 18 | Ht 69.0 in | Wt 195.0 lb

## 2015-08-15 DIAGNOSIS — D129 Benign neoplasm of anus and anal canal: Secondary | ICD-10-CM

## 2015-08-15 DIAGNOSIS — Z86010 Personal history of colonic polyps: Secondary | ICD-10-CM | POA: Diagnosis present

## 2015-08-15 DIAGNOSIS — D122 Benign neoplasm of ascending colon: Secondary | ICD-10-CM

## 2015-08-15 DIAGNOSIS — Z1211 Encounter for screening for malignant neoplasm of colon: Secondary | ICD-10-CM | POA: Diagnosis not present

## 2015-08-15 DIAGNOSIS — D128 Benign neoplasm of rectum: Secondary | ICD-10-CM | POA: Diagnosis not present

## 2015-08-15 MED ORDER — SODIUM CHLORIDE 0.9 % IV SOLN: 500.0000 mL | INTRAVENOUS | Status: DC

## 2015-08-15 NOTE — Progress Notes (Signed)
Called to room to assist during endoscopic procedure.  Patient ID and intended procedure confirmed with present staff. Received instructions for my participation in the procedure from the performing physician.  

## 2015-08-15 NOTE — Patient Instructions (Signed)
Resume previous diet, continue present medications, repeat colonoscopy depending on pathology results.  YOU HAD AN ENDOSCOPIC PROCEDURE TODAY AT [facility]:   Refer to the procedure report that was given to you for any specific questions about what was found during the examination.  If the procedure report does not answer your questions, please call your gastroenterologist to clarify.  If you requested that your care partner not be given the details of your procedure findings, then the procedure report has been included in a sealed envelope for you to review at your convenience later.  YOU SHOULD EXPECT: Some feelings of bloating in the abdomen. Passage of more gas than usual.  Walking can help get rid of the air that was put into your GI tract during the procedure and reduce the bloating. If you had a lower endoscopy (such as a colonoscopy or flexible sigmoidoscopy) you may notice spotting of blood in your stool or on the toilet paper. If you underwent a bowel prep for your procedure, you may not have a normal bowel movement for a few days.  Please Note:  You might notice some irritation and congestion in your nose or some drainage.  This is from the oxygen used during your procedure.  There is no need for concern and it should clear up in a day or so.  SYMPTOMS TO REPORT IMMEDIATELY:   Following lower endoscopy (colonoscopy or flexible sigmoidoscopy):  Excessive amounts of blood in the stool  Significant tenderness or worsening of abdominal pains  Swelling of the abdomen that is new, acute  Fever of 100F or higher   Following upper endoscopy (EGD)  Vomiting of blood or coffee ground material  New chest pain or pain under the shoulder blades  Painful or persistently difficult swallowing  New shortness of breath  Fever of 100F or higher  Black, tarry-looking stools  For urgent or emergent issues, a gastroenterologist can be reached at any hour by calling [phone]  [phone].   DIET: Your first meal following the procedure should be a small meal and then it is ok to progress to your normal diet. Heavy or fried foods are harder to digest and may make you feel nauseous or bloated.  Likewise, meals heavy in dairy and vegetables can increase bloating.  Drink plenty of fluids but you should avoid alcoholic beverages for 24 hours.  ACTIVITY:  You should plan to take it easy for the rest of today and you should NOT DRIVE or use heavy machinery until tomorrow (because of the sedation medicines used during the test).    FOLLOW UP: Our staff will call the number listed on your records the next business day following your procedure to check on you and address any questions or concerns that you may have regarding the information given to you following your procedure. If we do not reach you, we will leave a message.  However, if you are feeling well and you are not experiencing any problems, there is no need to return our call.  We will assume that you have returned to your regular daily activities without incident.  If any biopsies were taken you will be contacted by phone or by letter within the next 1-3 weeks.  Please call us at [phone] if you have not heard about the biopsies in 3 weeks.    SIGNATURES/CONFIDENTIALITY: You and/or your care partner have signed paperwork which will be entered into your electronic medical record.  These signatures attest to the fact  that that the information above on your After Visit Summary has been reviewed and is understood.  Full responsibility of the confidentiality of this discharge information lies with you and/or your care-partner.

## 2015-08-15 NOTE — Progress Notes (Signed)
A/ox3 pleased with MAC, report to Tracy RN 

## 2015-08-15 NOTE — Op Note (Signed)
[name] Procedure Date: 08/15/2015 11:37 AM MRN: [id number] Endoscopist: Jerene Bears , MD Age: 55 Referring MD:  Date of Birth: 1960/08/26 Gender: Male Procedure:                Colonoscopy Indications:              Surveillance: Personal history of adenomatous                            polyps on last colonoscopy 5 years ago Medicines:                Monitored Anesthesia Care Procedure:                Pre-Anesthesia Assessment:                           - Prior to the procedure, a History and Physical                            was performed, and patient medications and                            allergies were reviewed. The patient's tolerance of                            previous anesthesia was also reviewed. The risks                            and benefits of the procedure and the sedation                            options and risks were discussed with the patient.                            All questions were answered, and informed consent                            was obtained. Prior Anticoagulants: The patient has                            taken no previous anticoagulant or antiplatelet                            agents. ASA Grade Assessment: II - A patient with                            mild systemic disease. After reviewing the risks                            and benefits, the patient was deemed in                            satisfactory condition to undergo the procedure.  After obtaining informed consent, the colonoscope                            was passed under direct vision. Throughout the                            procedure, the patient's blood pressure, pulse, and                            oxygen saturations were monitored continuously. The                            Model CF-HQ190L [id number]) scope was introduced                            through the anus and advanced to the the cecum,                            identified by appendiceal orifice and ileocecal                            valve. The colonoscopy was performed without                            difficulty. The patient tolerated the procedure                            well. The quality of the bowel preparation was                            good. The ileocecal valve, appendiceal orifice, and                            rectum were photographed. Scope In: 11:47:45 AM Scope Out: 12:02:32 PM Scope Withdrawal Time: 0 hours 12 minutes 16 seconds  Total Procedure Duration: 0 hours 14 minutes 47 seconds  Findings:                 The digital rectal exam was normal.                           A 4 mm polyp was found in the ascending colon. The                            polyp was sessile. The polyp was removed with a                            cold snare. Resection and retrieval were complete.                           Two sessile polyps were found in the rectum. The                            polyps were 3 to 5 mm  in size. These polyps were                            removed with a cold snare. Resection and retrieval                            were complete.                           Multiple small and large-mouthed diverticula were                            found in the sigmoid colon, descending colon,                            hepatic flexure and ascending colon.                           Internal hemorrhoids were found during                            retroflexion. The hemorrhoids were small. Complications:            No immediate complications. Estimated Blood Loss:     Estimated blood loss: none. Impression:               - One 4 mm polyp in the ascending colon, removed                            with a cold snare. Resected and retrieved.                           - Two 3 to 5 mm polyps in the rectum, removed with                            a cold snare. Resected and retrieved.                           -  Moderate diverticulosis in the sigmoid colon, in                            the descending colon, at the hepatic flexure and in                            the ascending colon.                           - Internal hemorrhoids. Recommendation:           - Patient has a contact number available for                            emergencies. The signs and symptoms of potential                            delayed complications were discussed  with the                            patient. Return to normal activities tomorrow.                            Written discharge instructions were provided to the                            patient.                           - Resume previous diet.                           - Continue present medications.                           - Await pathology results.                           - Repeat colonoscopy is recommended for adenoma                            surveillance. The colonoscopy date will be                            determined after pathology results from today's                            exam become available for review. Jerene Bears, MD 08/15/2015 12:06:19 PM This report has been signed electronically.

## 2015-08-16 ENCOUNTER — Telehealth: Payer: Self-pay | Admitting: *Deleted

## 2015-08-16 NOTE — Telephone Encounter (Signed)
  Follow up Call-  Call back number 08/15/2015  Post procedure Call Back phone  # [phone]  Permission to leave phone message Yes     [facility] on number  that  Identifies pt by first and last name to return call if needed MM, RN

## 2015-08-20 ENCOUNTER — Encounter: Payer: Self-pay | Admitting: Internal Medicine

## 2015-08-28 ENCOUNTER — Other Ambulatory Visit: Payer: Self-pay | Admitting: Family Medicine

## 2015-08-29 NOTE — Telephone Encounter (Signed)
ok 

## 2015-08-29 NOTE — Telephone Encounter (Signed)
Ok to refill??  Last office visit 07/25/2015.  Last labs on 02/08/2015 WNL.

## 2015-08-30 ENCOUNTER — Encounter: Payer: Self-pay | Admitting: Family Medicine

## 2015-08-30 NOTE — Telephone Encounter (Signed)
Medication called to pharmacy. 

## 2015-09-03 DIAGNOSIS — M5136 Other intervertebral disc degeneration, lumbar region: Secondary | ICD-10-CM | POA: Diagnosis not present

## 2015-09-17 DIAGNOSIS — M545 Low back pain: Secondary | ICD-10-CM | POA: Diagnosis not present

## 2015-11-19 DIAGNOSIS — M545 Low back pain: Secondary | ICD-10-CM | POA: Diagnosis not present

## 2015-11-26 ENCOUNTER — Ambulatory Visit (INDEPENDENT_AMBULATORY_CARE_PROVIDER_SITE_OTHER): Payer: BLUE CROSS/BLUE SHIELD | Admitting: Family Medicine

## 2015-11-26 ENCOUNTER — Encounter: Payer: Self-pay | Admitting: Family Medicine

## 2015-11-26 VITALS — BP 120/76 | HR 80 | Temp 98.1°F | Resp 14 | Ht 69.0 in | Wt 188.0 lb

## 2015-11-26 DIAGNOSIS — E291 Testicular hypofunction: Secondary | ICD-10-CM

## 2015-11-26 DIAGNOSIS — Z23 Encounter for immunization: Secondary | ICD-10-CM

## 2015-11-26 DIAGNOSIS — F411 Generalized anxiety disorder: Secondary | ICD-10-CM

## 2015-11-26 MED ORDER — ESCITALOPRAM OXALATE 10 MG PO TABS
10.0000 mg | ORAL_TABLET | Freq: Every day | ORAL | 5 refills | Status: DC
Start: 2015-11-26 — End: 2016-05-25

## 2015-11-26 MED ORDER — ALPRAZOLAM 0.5 MG PO TABS: 0.5000 mg | ORAL_TABLET | Freq: Three times a day (TID) | ORAL | 0 refills | Status: DC | PRN

## 2015-11-26 NOTE — Progress Notes (Signed)
Subjective:    Patient ID: Casey Stephens, male    DOB: 1961/01/29, 55 y.o.   MRN: [id number]  HPI The patient presents reporting worsening anxiety.  Symptoms have been gradually worsening over the last few months. However it is now, almost a daily phenomenon. He reports daily anxiety. He has occasional panic attacks. He reports poor concentration. He denies any lack of energy. He denies any insomnia. He denies anhedonia. He does have some mild symptoms of depression. However the anxiety has become unbearable. The majority stems from problems at work and stress related to work. He is also due to recheck a PSA and testosterone level Past Medical History:  Diagnosis Date  . Allergy    Past Surgical History:  Procedure Laterality Date  . av fistula lt temple     ruptured artery from softball injury  . crushed thumb repair    . testicular biopsy     negative   Current Outpatient Prescriptions on File Prior to Visit  Medication Sig Dispense Refill  . acetaminophen (TYLENOL) 650 MG CR tablet Take 650 mg by mouth every 8 (eight) hours as needed for pain (2 tabs).    Marland Kitchen aspirin 81 MG tablet Take 81 mg by mouth daily.      . B-D 3CC LUER-LOK SYR 23GX1" 23G X 1" 3 ML MISC USE TO INJECT TESTOSTERONE EVERY 2 WEEKS 6 each 3  . cetirizine (ZYRTEC) 10 MG tablet Take 10 mg by mouth as needed for allergies.    . diazepam (VALIUM) 5 MG tablet Take 5 mg by mouth every 6 (six) hours as needed for anxiety (as needed for muscle spasms).    . EPINEPHrine (EPI-PEN) 0.3 mg/0.3 mL SOAJ Inject 0.3 mLs (0.3 mg total) into the muscle once. 1 Device 5  . fish oil-omega-3 fatty acids 1000 MG capsule Take 2 g by mouth daily.      Marland Kitchen testosterone cypionate (DEPOTESTOSTERONE CYPIONATE) 200 MG/ML injection INJECT 1/2 ML INTO THE MUSCLE EVERY 2 WEEKS AS DIRECTED 10 mL 0   No current facility-administered medications on file prior to visit.    No Known Allergies Social History   Social History  . Marital status:  Married    Spouse name: N/A  . Number of children: N/A  . Years of education: N/A   Occupational History  . Not on file.   Social History Main Topics  . Smoking status: Former Smoker    Packs/day: 0.50    Years: 25.00    Types: Cigarettes    Quit date: 01/29/2013  . Smokeless tobacco: Never Used  . Alcohol use 0.6 oz/week    1 Cans of beer per week  . Drug use: No  . Sexual activity: Not on file   Other Topics Concern  . Not on file   Social History Narrative  . No narrative on file      Review of Systems  All other systems reviewed and are negative.      Objective:   Physical Exam  Constitutional: He appears well-developed and well-nourished.  Cardiovascular: Normal rate, regular rhythm and normal heart sounds.   Pulmonary/Chest: Effort normal and breath sounds normal.  Psychiatric: He has a normal mood and affect. His behavior is normal. Judgment and thought content normal.  Vitals reviewed.         Assessment & Plan:  GAD (generalized anxiety disorder) - Plan: ALPRAZolam (XANAX) 0.5 MG tablet, escitalopram (LEXAPRO) 10 MG tablet  Hypogonadism in male - Plan: PSA,  Testosterone  Begin Lexapro 10 mg a day as a way to help control his anxiety. Recheck in 4 weeks. He can use Xanax temporarily 0.5 mg 1 every 8 hours as needed for anxiety until the Lexapro takes effect. I will also recheck his testosterone level and his PSA

## 2015-11-26 NOTE — Addendum Note (Signed)
Addended by: Shary Decamp B on: 11/26/2015 05:12 PM   Modules accepted: Orders

## 2015-11-27 LAB — TESTOSTERONE: Testosterone: 351 ng/dL (ref 250–827)

## 2015-11-27 LAB — PSA: PSA: 0.6 ng/mL (ref <=4.0)

## 2015-11-30 ENCOUNTER — Encounter: Payer: Self-pay | Admitting: Family Medicine

## 2015-12-04 ENCOUNTER — Encounter: Payer: Self-pay | Admitting: Family Medicine

## 2015-12-17 ENCOUNTER — Telehealth: Payer: Self-pay | Admitting: Family Medicine

## 2015-12-17 NOTE — Telephone Encounter (Signed)
Pt called and states that he is doing better on Lexapro but is still having some fatigue and would like to know if he should go up on his testosterone injections?

## 2015-12-17 NOTE — Telephone Encounter (Signed)
Increase testosterone to 200 mg every 2 weeks and recheck lab work in 3 months

## 2015-12-18 NOTE — Telephone Encounter (Signed)
Patient aware of providers recommendations.  

## 2016-01-20 ENCOUNTER — Other Ambulatory Visit: Payer: Self-pay | Admitting: Family Medicine

## 2016-01-20 MED ORDER — TESTOSTERONE CYPIONATE 200 MG/ML IM SOLN: 200.0000 mg | INTRAMUSCULAR | 1 refills | Status: DC

## 2016-01-20 NOTE — Telephone Encounter (Signed)
Needs refill on testosterone since increasing dosage. Med called to pharm.

## 2016-02-11 DIAGNOSIS — M5136 Other intervertebral disc degeneration, lumbar region: Secondary | ICD-10-CM | POA: Diagnosis not present

## 2016-03-06 IMAGING — MR MR CERVICAL SPINE WO/W CM
4 of 8 series · 21 of 48 positions shown · IV contrast (multihance)
Comparison: 08/20/2014

CLINICAL DATA: Thoracic syrinx. Cervical spondylosis seen on scout
images.

EXAM:
MRI CERVICAL SPINE WITHOUT AND WITH CONTRAST
TECHNIQUE: Multiplanar and multiecho pulse sequences of the cervical spine, to
include the craniocervical junction and cervicothoracic junction,
were obtained according to standard protocol without and with
intravenous contrast.
CONTRAST:  17mL MULTIHANCE GADOBENATE DIMEGLUMINE 529 MG/ML IV SOLN

[Series 2: T1 · sagittal · 3.0mm · 0.41mm/px · 3 of 13 slices shown]
[im 1/13]
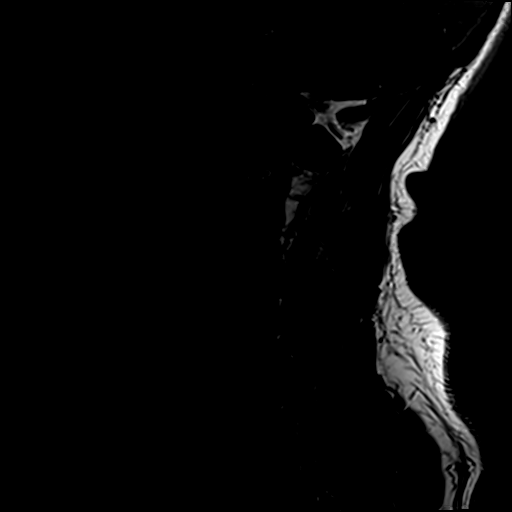
[im 9/13]
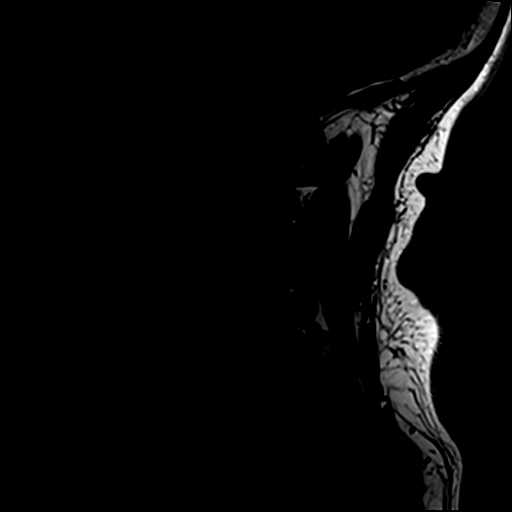
[im 13/13]
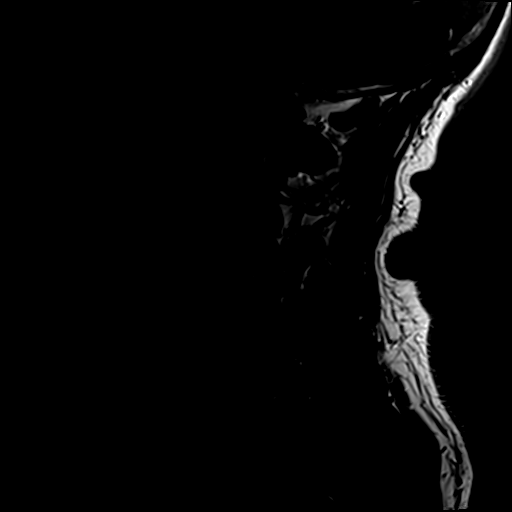

[Series 4: T2 · axial · 3.0mm · 0.39mm/px · z∈[-103,-11]mm · 8 of 26 slices shown (1 of 3)]
[im 1/26]
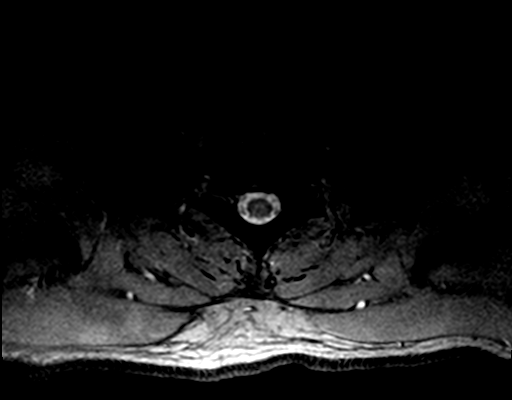
[im 4/26]
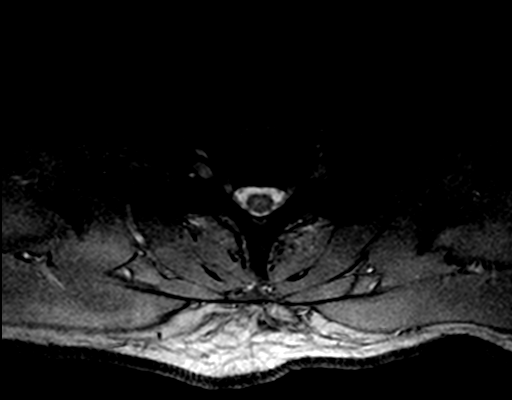
[im 8/26]
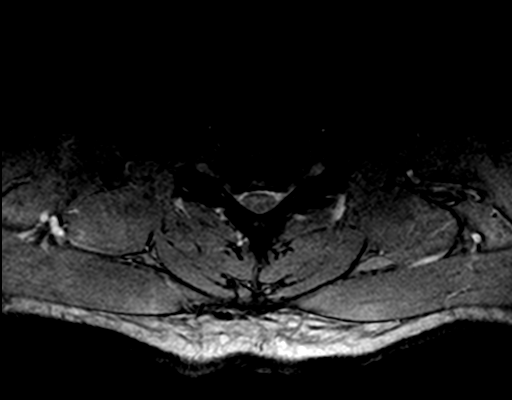
[im 11/26]
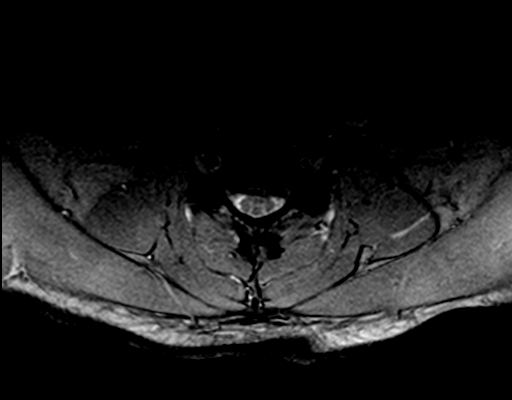
[im 15/26]
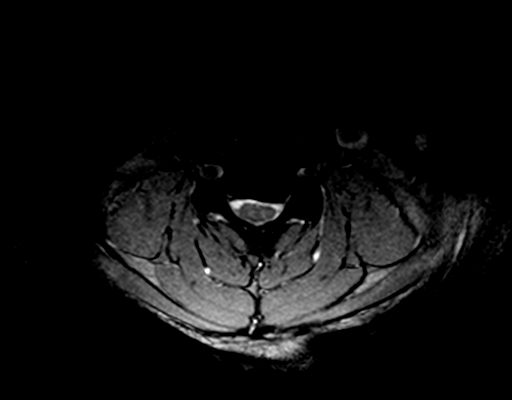
[im 18/26]
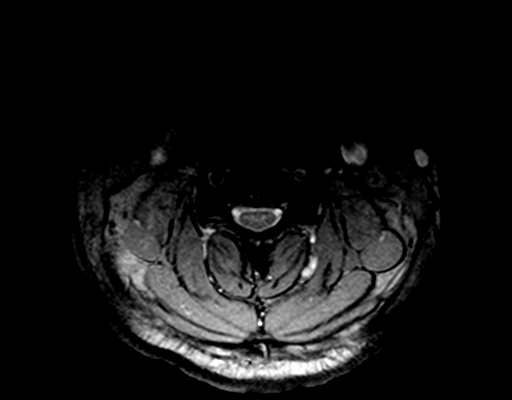
[im 22/26]
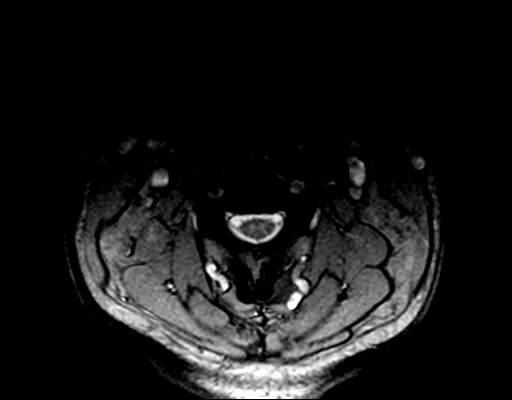
[im 26/26]
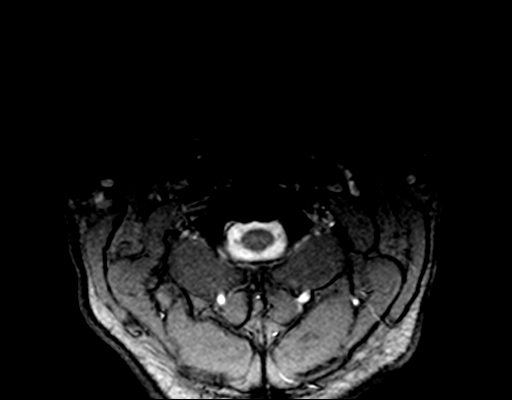

[Series 5: T2 · axial · 3.0mm · 0.39mm/px · z∈[-103,-26]mm · 7 of 26 slices shown (2 of 3)]
[im 1/26]
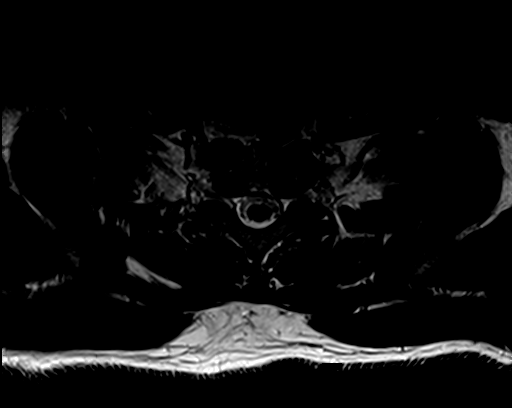
[im 4/26]
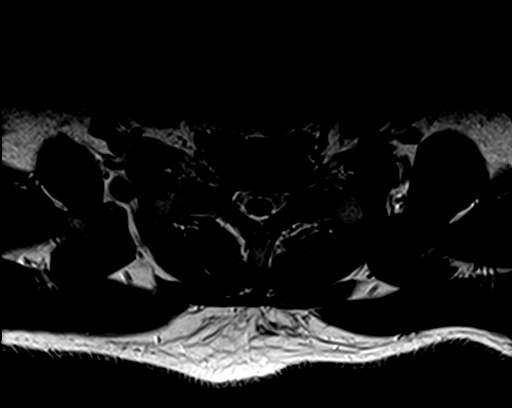
[im 8/26]
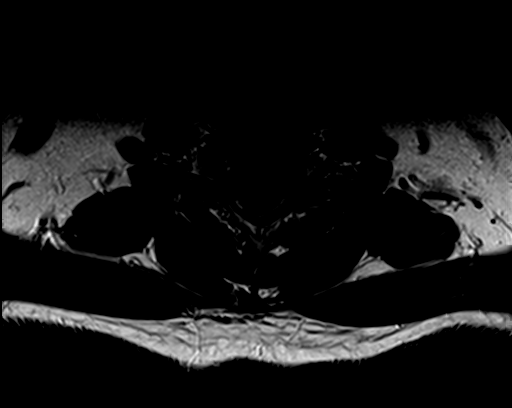
[im 11/26]
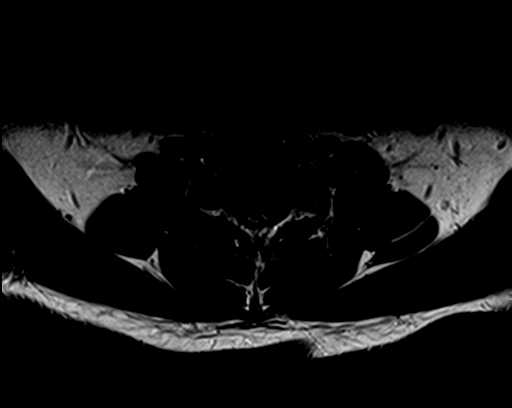
[im 15/26]
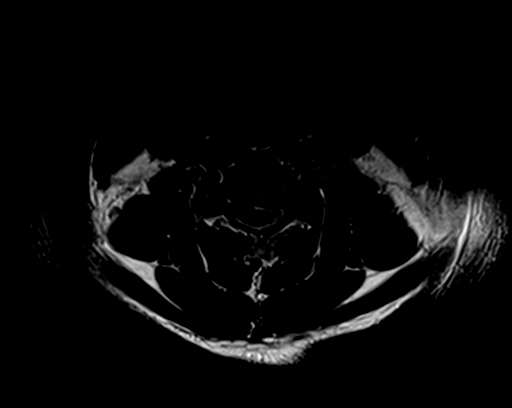
[im 18/26]
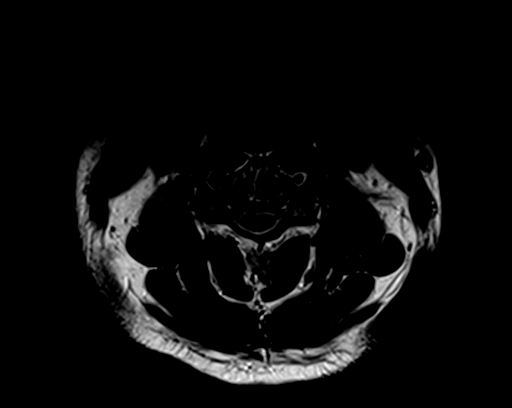
[im 22/26]
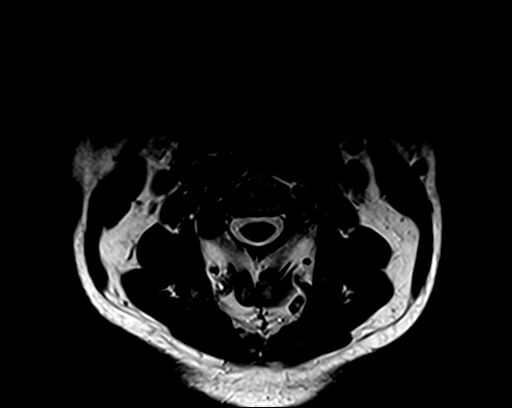

[Series 7: T2 · sagittal · 3.0mm · 0.41mm/px · 3 of 13 slices shown (3 of 3)]
[im 1/13]
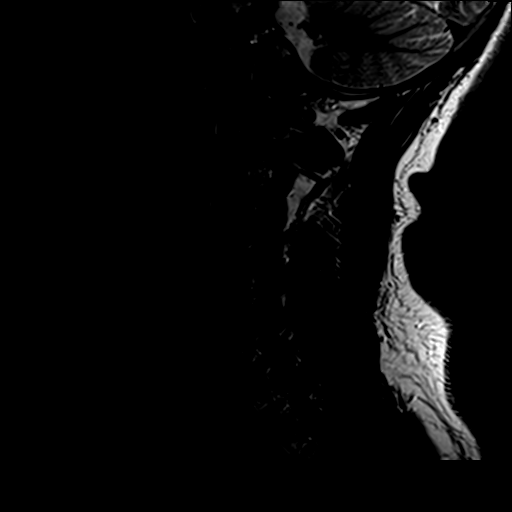
[im 9/13]
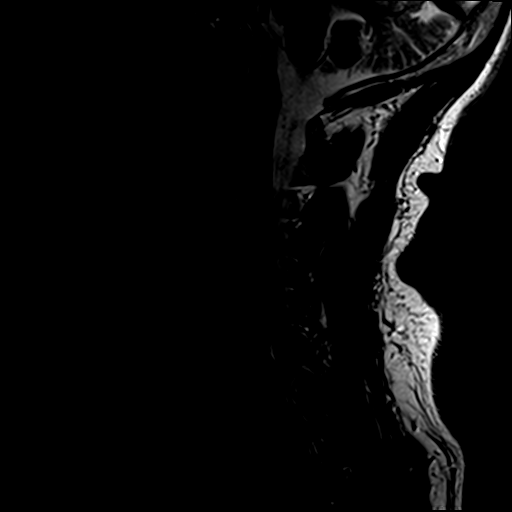
[im 13/13]
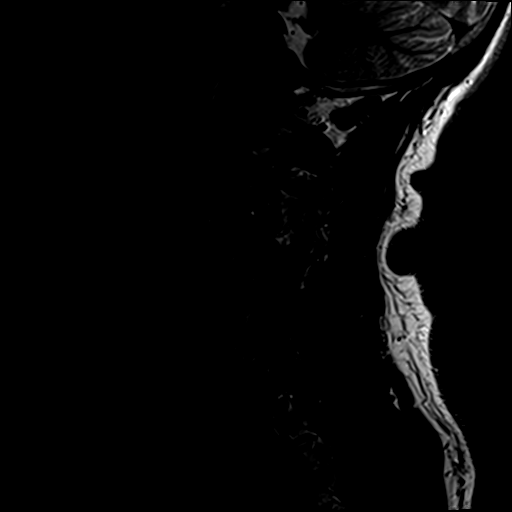

[21 of 48 positions shown; findings below may reference images not displayed]

FINDINGS: The craniocervical junction appears unremarkable.

There is abnormal high T2 signal in the gray matter of the cord at
the C5 and C5-6 vertical levels, as shown on images 14-16 of series
5, especially involving the posterior horns. The vertically, this
extends over a 1.2 cm extent.

Cervical cord otherwise unremarkable. No abnormal enhancement in is
identified in the vicinity of the gray matter abnormality or
elsewhere in the cervical cord.

No vertebral subluxation is observed. No significant vertebral
marrow edema is identified. Short pedicles in the cervical spine
noted. Loss of cervical intervertebral disc height at all levels
between C3 and C7.

Mild degenerative endplate findings are present at C5-6 and C6-7.

Additional findings at individual levels are as follows:

C2-3:  Unremarkable.

C3-4: Prominent left and moderate to prominent right foraminal
stenosis and mild central narrowing of the thecal sac due to disc
bulge, uncinate spurring, and facet arthropathy.

C4-5: Prominent bilateral foraminal stenosis and mild left eccentric
central narrowing of the thecal sac due to intervertebral spurring,
uncinate spurring, and facet arthropathy.

C5-6: Prominent bilateral foraminal stenosis and moderate central
narrowing of the thecal sac due to disc osteophyte complex, uncinate
spurring, and facet arthropathy. This is also the level where there
is abnormal edema/gliosis tracking in the gray matter tracts.
Central disc protrusion noted.

C6-7: Prominent bilateral foraminal stenosis and mild central
narrowing of the thecal sac due to posterior osseous ridging,
uncinate spurring, and facet arthropathy. Central disc protrusion
noted.

C7-T1: Mild left foraminal stenosis due to facet arthropathy and
disc bulge.
IMPRESSION: 1. Abnormal gray matter track T2 signal hyperintensity in a
bilaterally symmetric manner at C5 and the C5-6 level extending over
a 1.2 cm vertical excursion. This especially involves the posterior
horns. Posterior horn involvement can be implicated in the setting
of vitamin B12 deficiency or Jakari deficiency, however both of
these are normally associated with a much longer segment of
involvement. Accordingly, while checking serum B[REDACTED] levels
is probably warranted, the abnormal gliosis/edema in the gray matter
at this level may be related to an old insult.
2. Cervical spondylosis and degenerative disc disease cause
prominent impingement at all levels between C3 and C7, and mild
impingement at the C7-T1 level, as detailed above.

## 2016-03-25 ENCOUNTER — Other Ambulatory Visit: Payer: Self-pay | Admitting: Family Medicine

## 2016-03-25 DIAGNOSIS — F411 Generalized anxiety disorder: Secondary | ICD-10-CM

## 2016-03-27 NOTE — Telephone Encounter (Signed)
ok 

## 2016-03-27 NOTE — Telephone Encounter (Signed)
Ok to refill 

## 2016-04-09 ENCOUNTER — Telehealth: Payer: Self-pay | Admitting: Family Medicine

## 2016-04-09 NOTE — Telephone Encounter (Signed)
Received a fax requesting a refill on Valium - Ok to refill??        (pt has not had this filled since 09/18/15 - he was given xanax on 03/27/16 #60/2)  ?possible fax was a mistake just wanted to make sure before denying it??

## 2016-04-10 NOTE — Telephone Encounter (Signed)
denied °

## 2016-04-10 NOTE — Telephone Encounter (Signed)
LMOVM at [facility] to DC rx for valium

## 2016-04-14 DIAGNOSIS — M47816 Spondylosis without myelopathy or radiculopathy, lumbar region: Secondary | ICD-10-CM | POA: Diagnosis not present

## 2016-05-25 ENCOUNTER — Encounter: Payer: Self-pay | Admitting: Family Medicine

## 2016-05-25 ENCOUNTER — Ambulatory Visit (INDEPENDENT_AMBULATORY_CARE_PROVIDER_SITE_OTHER): Payer: BLUE CROSS/BLUE SHIELD | Admitting: Family Medicine

## 2016-05-25 VITALS — BP 144/94 | HR 80 | Temp 98.2°F | Resp 18 | Ht 69.0 in | Wt 192.0 lb

## 2016-05-25 DIAGNOSIS — E291 Testicular hypofunction: Secondary | ICD-10-CM | POA: Diagnosis not present

## 2016-05-25 DIAGNOSIS — F411 Generalized anxiety disorder: Secondary | ICD-10-CM | POA: Diagnosis not present

## 2016-05-25 MED ORDER — DIAZEPAM 5 MG PO TABS: 5.0000 mg | ORAL_TABLET | Freq: Four times a day (QID) | ORAL | 0 refills | Status: DC | PRN

## 2016-05-25 MED ORDER — ESCITALOPRAM OXALATE 10 MG PO TABS: 10.0000 mg | ORAL_TABLET | Freq: Every day | ORAL | 5 refills | Status: DC

## 2016-05-25 MED ORDER — ALPRAZOLAM 0.5 MG PO TABS: 0.5000 mg | ORAL_TABLET | Freq: Three times a day (TID) | ORAL | 2 refills | Status: DC | PRN

## 2016-05-25 NOTE — Progress Notes (Signed)
Subjective:    Patient ID: Casey Stephens, male    DOB: [birth year], 56 y.o.   MRN: [id number]  HPI  11/2015 The patient presents reporting worsening anxiety.  Symptoms have been gradually worsening over the last few months. However it is now, almost a daily phenomenon. He reports daily anxiety. He has occasional panic attacks. He reports poor concentration. He denies any lack of energy. He denies any insomnia. He denies anhedonia. He does have some mild symptoms of depression. However the anxiety has become unbearable. The majority stems from problems at work and stress related to work. He is also due to recheck a PSA and testosterone level.  At that time, my plan was: Begin Lexapro 10 mg a day as a way to help control his anxiety. Recheck in 4 weeks. He can use Xanax temporarily 0.5 mg 1 every 8 hours as needed for anxiety until the Lexapro takes effect. I will also recheck his testosterone level and his PSA  05/25/16 Patient feels much better since starting Lexapro. He is only using Xanax 1-2 times a week for anxiety. Overall his anxiety has improved. He still uses Valium 1-2 times a week for muscle spasms in his back. He knows not to mix the 2 medications together. He is also here to recheck a testosterone level. He is currently on 200 mg IM of testosterone every 2 weeks. He states that his energy level has improved. Overall he feels better on the testosterone. He denies any side effects from the testosterone. Past Medical History:  Diagnosis Date  . Allergy    Past Surgical History:  Procedure Laterality Date  . av fistula lt temple     ruptured artery from softball injury  . crushed thumb repair    . testicular biopsy     negative   Current Outpatient Prescriptions on File Prior to Visit  Medication Sig Dispense Refill  . acetaminophen (TYLENOL) 650 MG CR tablet Take 650 mg by mouth every 8 (eight) hours as needed for pain (2 tabs).    Marland Kitchen aspirin 81 MG tablet Take 81 mg by mouth  daily.      . B-D 3CC LUER-LOK SYR 23GX1" 23G X 1" 3 ML MISC USE TO INJECT TESTOSTERONE EVERY 2 WEEKS 6 each 3  . cetirizine (ZYRTEC) 10 MG tablet Take 10 mg by mouth as needed for allergies.    Marland Kitchen EPINEPHrine (EPI-PEN) 0.3 mg/0.3 mL SOAJ Inject 0.3 mLs (0.3 mg total) into the muscle once. 1 Device 5  . fish oil-omega-3 fatty acids 1000 MG capsule Take 2 g by mouth daily.      Marland Kitchen testosterone cypionate (DEPOTESTOSTERONE CYPIONATE) 200 MG/ML injection Inject 1 mL (200 mg total) into the muscle every 14 (fourteen) days. 10 mL 1   No current facility-administered medications on file prior to visit.    No Known Allergies Social History   Social History  . Marital status: Married    Spouse name: N/A  . Number of children: N/A  . Years of education: N/A   Occupational History  . Not on file.   Social History Main Topics  . Smoking status: Former Smoker    Packs/day: 0.50    Years: 25.00    Types: Cigarettes    Quit date: 01/29/2013  . Smokeless tobacco: Never Used  . Alcohol use 0.6 oz/week    1 Cans of beer per week  . Drug use: No  . Sexual activity: Not on file   Other Topics Concern  .  Not on file   Social History Narrative  . No narrative on file      Review of Systems  All other systems reviewed and are negative.      Objective:   Physical Exam  Constitutional: He appears well-developed and well-nourished.  Cardiovascular: Normal rate, regular rhythm and normal heart sounds.   Pulmonary/Chest: Effort normal and breath sounds normal.  Psychiatric: He has a normal mood and affect. His behavior is normal. Judgment and thought content normal.  Vitals reviewed.         Assessment & Plan:  Hypogonadism in male - Plan: CBC with Differential/Platelet, COMPLETE METABOLIC PANEL WITH GFR, PSA, Testosterone Total,Free,Bio, Males  GAD (generalized anxiety disorder) - Plan: ALPRAZolam (XANAX) 0.5 MG tablet, escitalopram (LEXAPRO) 10 MG tablet Check a testosterone level  along with a CBC and a PSA. If levels are within normal range, home and no changes in his testosterone dose and I will recheck it in 6 months. Continue the patient on Lexapro 10 mg a day for anxiety. I refilled the patient Xanax which he uses 1-2 tablets per week sparingly for anxiety attacks. Also refill his Valium which he uses 1-2 tablets a week sparingly as needed for muscle spasms in his back.

## 2016-05-26 LAB — CBC WITH DIFFERENTIAL/PLATELET
BASOS ABS: 83 {cells}/uL (ref 0–200)
BASOS PCT: 1 %
EOS PCT: 2 %
Eosinophils Absolute: 166 cells/uL (ref 15–500)
HCT: 49.1 % (ref 38.5–50.0)
HEMOGLOBIN: 16.6 g/dL (ref 13.0–17.0)
LYMPHS ABS: 2822 {cells}/uL (ref 850–3900)
Lymphocytes Relative: 34 %
MCH: 30.6 pg (ref 27.0–33.0)
MCHC: 33.8 g/dL (ref 32.0–36.0)
MCV: 90.4 fL (ref 80.0–100.0)
MPV: 10.3 fL (ref 7.5–12.5)
Monocytes Absolute: 913 cells/uL (ref 200–950)
Monocytes Relative: 11 %
NEUTROS ABS: 4316 {cells}/uL (ref 1500–7800)
Neutrophils Relative %: 52 %
PLATELETS: 215 10*3/uL (ref 140–400)
RBC: 5.43 MIL/uL (ref 4.20–5.80)
RDW: 13 % (ref 11.0–15.0)
WBC: 8.3 10*3/uL (ref 3.8–10.8)

## 2016-05-26 LAB — COMPLETE METABOLIC PANEL WITH GFR
ALT: 28 U/L (ref 9–46)
AST: 21 U/L (ref 10–35)
Albumin: 4.2 g/dL (ref 3.6–5.1)
Alkaline Phosphatase: 71 U/L (ref 40–115)
BILIRUBIN TOTAL: 0.5 mg/dL (ref 0.2–1.2)
BUN: 12 mg/dL (ref 7–25)
CALCIUM: 9.2 mg/dL (ref 8.6–10.3)
CO2: 24 mmol/L (ref 20–31)
CREATININE: 1.13 mg/dL (ref 0.70–1.33)
Chloride: 107 mmol/L (ref 98–110)
GFR, EST AFRICAN AMERICAN: 84 mL/min (ref >=60)
GFR, Est Non African American: 72 mL/min (ref >=60)
Glucose, Bld: 100 mg/dL — ABNORMAL HIGH (ref 70–99)
Potassium: 4.1 mmol/L (ref 3.5–5.3)
Sodium: 142 mmol/L (ref 135–146)
TOTAL PROTEIN: 6.4 g/dL (ref 6.1–8.1)

## 2016-05-26 LAB — TESTOSTERONE TOTAL,FREE,BIO, MALES
ALBUMIN: 4.2 g/dL (ref 3.6–5.1)
SEX HORMONE BINDING: 29 nmol/L (ref 22–77)
TESTOSTERONE BIOAVAILABLE: 344.6 ng/dL (ref 110.0–575.0)
TESTOSTERONE FREE: 178.9 pg/mL (ref 46.0–224.0)
Testosterone: 985 ng/dL — ABNORMAL HIGH (ref 250–827)

## 2016-05-26 LAB — PSA: PSA: 1 ng/mL (ref <=4.0)

## 2016-05-28 ENCOUNTER — Encounter: Payer: Self-pay | Admitting: Family Medicine

## 2016-06-03 ENCOUNTER — Ambulatory Visit (INDEPENDENT_AMBULATORY_CARE_PROVIDER_SITE_OTHER): Payer: BLUE CROSS/BLUE SHIELD | Admitting: Physician Assistant

## 2016-06-03 ENCOUNTER — Encounter: Payer: Self-pay | Admitting: Physician Assistant

## 2016-06-03 VITALS — BP 140/92 | HR 84 | Temp 97.6°F | Resp 16 | Ht 69.0 in | Wt 192.0 lb

## 2016-06-03 DIAGNOSIS — I10 Essential (primary) hypertension: Secondary | ICD-10-CM | POA: Diagnosis not present

## 2016-06-03 DIAGNOSIS — R079 Chest pain, unspecified: Secondary | ICD-10-CM

## 2016-06-03 NOTE — Progress Notes (Signed)
Patient ID: Casey Stephens MRN: [id number], DOB: 06/18/1960, 56 y.o. Date of Encounter: @DATE @  Chief Complaint:  Chief Complaint  Patient presents with  . BP elevated and chest pain    HPI: 55 y.o. year old male  presents with above.   He reports that at his recent visit with Dr. Dennard Schaumann his blood pressure was slightly high. At that visit, Dr. Dennard Schaumann recommended him come by here and get his blood pressure checked a couple of times and if it remains elevated would add blood pressure medication. Says that today he noticed some sharp shooting fleeting chest pain so he decided to stop by here to get his blood pressure checked and get that checked.  He has had no chest pressure, heaviness, tightness, squeezing. No increased shortness of breath or dyspnea on exertion. The only discomfort he has felt in his chest are shooting fleeting sharp pains.   Past Medical History:  Diagnosis Date  . Allergy      Home Meds: Outpatient Medications Prior to Visit  Medication Sig Dispense Refill  . acetaminophen (TYLENOL) 650 MG CR tablet Take 650 mg by mouth every 8 (eight) hours as needed for pain (2 tabs).    . ALPRAZolam (XANAX) 0.5 MG tablet Take 1 tablet (0.5 mg total) by mouth 3 (three) times daily as needed. for anxiety 30 tablet 2  . aspirin 81 MG tablet Take 81 mg by mouth daily.      . B-D 3CC LUER-LOK SYR 23GX1" 23G X 1" 3 ML MISC USE TO INJECT TESTOSTERONE EVERY 2 WEEKS 6 each 3  . cetirizine (ZYRTEC) 10 MG tablet Take 10 mg by mouth as needed for allergies.    . diazepam (VALIUM) 5 MG tablet Take 1 tablet (5 mg total) by mouth every 6 (six) hours as needed for anxiety (as needed for muscle spasms). 30 tablet 0  . EPINEPHrine (EPI-PEN) 0.3 mg/0.3 mL SOAJ Inject 0.3 mLs (0.3 mg total) into the muscle once. 1 Device 5  . escitalopram (LEXAPRO) 10 MG tablet Take 1 tablet (10 mg total) by mouth daily. 30 tablet 5  . fish oil-omega-3 fatty acids 1000 MG capsule Take 2 g by mouth daily.       Marland Kitchen testosterone cypionate (DEPOTESTOSTERONE CYPIONATE) 200 MG/ML injection Inject 1 mL (200 mg total) into the muscle every 14 (fourteen) days. 10 mL 1   No facility-administered medications prior to visit.     Allergies: No Known Allergies  Social History   Social History  . Marital status: Married    Spouse name: N/A  . Number of children: N/A  . Years of education: N/A   Occupational History  . Not on file.   Social History Main Topics  . Smoking status: Former Smoker    Packs/day: 0.50    Years: 25.00    Types: Cigarettes    Quit date: 01/29/2013  . Smokeless tobacco: Never Used  . Alcohol use 0.6 oz/week    1 Cans of beer per week  . Drug use: No  . Sexual activity: Not on file   Other Topics Concern  . Not on file   Social History Narrative  . No narrative on file    Family History  Problem Relation Age of Onset  . Diabetes Father   . Colon cancer Neg Hx      Review of Systems:  See HPI for pertinent ROS. All other ROS negative.    Physical Exam: Blood pressure (!) 140/92, pulse  84, temperature 97.6 F (36.4 C), temperature source Oral, resp. rate 16, height 5\' 9"  (1.753 m), weight 192 lb (87.1 kg)., Body mass index is 28.35 kg/m. General: WNWD WM. Appears in no acute distress.  Neck: Supple. No thyromegaly. No lymphadenopathy. Lungs: Clear bilaterally to auscultation without wheezes, rales, or rhonchi. Breathing is unlabored. Heart: RRR with S1 S2. No murmurs, rubs, or gallops. Musculoskeletal:  Strength and tone normal for age. Palpation of chest: There is one focal area of tenderness with palpation along left sternal border. Extremities/Skin: Warm and dry. Neuro: Alert and oriented X 3. Moves all extremities spontaneously. Gait is normal. CNII-XII grossly in tact. Psych:  Responds to questions appropriately with a normal affect.     ASSESSMENT AND PLAN:  56 y.o. year old male with   1. Chest pain, unspecified type - EKG 12-Lead---shows  NSR with NonSpecific ST - T changes.  Discussed with patient that his chest pain does not seem consistent with cardiac chest pain. This pain seems consistent with musculoskeletal chest pain.  2. Essential hypertension Discussed that the blood pressure reading today is very similar to blood pressure reading at his last visit here on 05/25/16 with Dr. Dennard Schaumann. I recommended that he go ahead and start a low-dose blood pressure medicine. He states that he wants to wait and come get 1 more blood pressure check prior to starting medicine. Says that if BP reading similar reading again, will start BP med.     Marin Olp [place], [place], [facility] 06/03/2016 5:12 PM

## 2016-07-14 DIAGNOSIS — M5136 Other intervertebral disc degeneration, lumbar region: Secondary | ICD-10-CM | POA: Diagnosis not present

## 2016-07-24 ENCOUNTER — Ambulatory Visit: Payer: BLUE CROSS/BLUE SHIELD

## 2016-07-24 ENCOUNTER — Other Ambulatory Visit: Payer: Self-pay

## 2016-07-24 VITALS — BP 142/110 | HR 83

## 2016-07-24 DIAGNOSIS — I10 Essential (primary) hypertension: Secondary | ICD-10-CM

## 2016-07-24 MED ORDER — LISINOPRIL-HYDROCHLOROTHIAZIDE 20-12.5 MG PO TABS: 1.0000 | ORAL_TABLET | Freq: Every day | ORAL | 1 refills | Status: DC

## 2016-07-24 NOTE — Progress Notes (Signed)
Medication sent to pharmacy and pt is aware that he should follow up in 1 month for a recheck. Patient states he understands

## 2016-07-24 NOTE — Progress Notes (Signed)
Patient came into office for a blood pressure check. Patient states his blood pressure had been up at previous appointments.   Patient states he has been having headaches as well as pressure in chest . Patient is req to start B/P medicine as previous discussed by his PCP  Info sent to PCP

## 2016-08-07 ENCOUNTER — Ambulatory Visit (INDEPENDENT_AMBULATORY_CARE_PROVIDER_SITE_OTHER): Payer: BLUE CROSS/BLUE SHIELD | Admitting: Family Medicine

## 2016-08-07 ENCOUNTER — Encounter: Payer: Self-pay | Admitting: Family Medicine

## 2016-08-07 VITALS — BP 100/70 | HR 82 | Temp 97.9°F | Resp 16 | Ht 69.0 in | Wt 187.0 lb

## 2016-08-07 DIAGNOSIS — I10 Essential (primary) hypertension: Secondary | ICD-10-CM | POA: Diagnosis not present

## 2016-08-07 MED ORDER — LISINOPRIL 10 MG PO TABS: 10.0000 mg | ORAL_TABLET | Freq: Every day | ORAL | 3 refills | Status: DC

## 2016-08-07 NOTE — Progress Notes (Signed)
Subjective:    Patient ID: Casey Stephens, male    DOB: Jul 21, 1960, 56 y.o.   MRN: [id number]  HPI  After his last visit, the patient was started on lisinopril/hydrochlorothiazide 20/12.5 one by mouth daily for hypertension. Since starting the medication, he reports orthostatic dizziness, fatigue, muscle cramps. He denies any chest pain or shortness of breath Past Medical History:  Diagnosis Date  . Allergy    Past Surgical History:  Procedure Laterality Date  . av fistula lt temple     ruptured artery from softball injury  . crushed thumb repair    . testicular biopsy     negative   Current Outpatient Prescriptions on File Prior to Visit  Medication Sig Dispense Refill  . acetaminophen (TYLENOL) 650 MG CR tablet Take 650 mg by mouth every 8 (eight) hours as needed for pain (2 tabs).    . ALPRAZolam (XANAX) 0.5 MG tablet Take 1 tablet (0.5 mg total) by mouth 3 (three) times daily as needed. for anxiety 30 tablet 2  . aspirin 81 MG tablet Take 81 mg by mouth daily.      . B-D 3CC LUER-LOK SYR 23GX1" 23G X 1" 3 ML MISC USE TO INJECT TESTOSTERONE EVERY 2 WEEKS 6 each 3  . cetirizine (ZYRTEC) 10 MG tablet Take 10 mg by mouth as needed for allergies.    . diazepam (VALIUM) 5 MG tablet Take 1 tablet (5 mg total) by mouth every 6 (six) hours as needed for anxiety (as needed for muscle spasms). 30 tablet 0  . EPINEPHrine (EPI-PEN) 0.3 mg/0.3 mL SOAJ Inject 0.3 mLs (0.3 mg total) into the muscle once. 1 Device 5  . escitalopram (LEXAPRO) 10 MG tablet Take 1 tablet (10 mg total) by mouth daily. 30 tablet 5  . fish oil-omega-3 fatty acids 1000 MG capsule Take 2 g by mouth daily.      Marland Kitchen testosterone cypionate (DEPOTESTOSTERONE CYPIONATE) 200 MG/ML injection Inject 1 mL (200 mg total) into the muscle every 14 (fourteen) days. 10 mL 1   No current facility-administered medications on file prior to visit.    No Known Allergies Social History   Social History  . Marital status: Married   Spouse name: N/A  . Number of children: N/A  . Years of education: N/A   Occupational History  . Not on file.   Social History Main Topics  . Smoking status: Former Smoker    Packs/day: 0.50    Years: 25.00    Types: Cigarettes    Quit date: 01/29/2013  . Smokeless tobacco: Never Used  . Alcohol use 0.6 oz/week    1 Cans of beer per week  . Drug use: No  . Sexual activity: Not on file   Other Topics Concern  . Not on file   Social History Narrative  . No narrative on file     Review of Systems  All other systems reviewed and are negative.      Objective:   Physical Exam  Cardiovascular: Normal rate, regular rhythm and normal heart sounds.   Pulmonary/Chest: Effort normal and breath sounds normal. No respiratory distress. He has no wheezes. He has no rales. He exhibits no tenderness.  Musculoskeletal: He exhibits no edema.  Vitals reviewed.         Assessment & Plan:  Benign essential HTN - Plan: lisinopril (PRINIVIL,ZESTRIL) 10 MG tablet  Essential hypertension  I believe the patient's on too much blood pressure medication. Discontinue Zestoretic and replace with  lisinopril 10 mg by mouth daily and recheck blood pressure in one month

## 2016-12-03 ENCOUNTER — Other Ambulatory Visit: Payer: Self-pay | Admitting: Family Medicine

## 2016-12-03 DIAGNOSIS — F411 Generalized anxiety disorder: Secondary | ICD-10-CM

## 2016-12-08 DIAGNOSIS — M5416 Radiculopathy, lumbar region: Secondary | ICD-10-CM | POA: Diagnosis not present

## 2016-12-08 DIAGNOSIS — M545 Low back pain: Secondary | ICD-10-CM | POA: Diagnosis not present

## 2017-03-26 DIAGNOSIS — M51369 Other intervertebral disc degeneration, lumbar region without mention of lumbar back pain or lower extremity pain: Secondary | ICD-10-CM | POA: Insufficient documentation

## 2017-04-05 ENCOUNTER — Other Ambulatory Visit: Payer: Self-pay | Admitting: Family Medicine

## 2017-04-06 NOTE — Telephone Encounter (Signed)
Requesting refill      LOV: 08/07/16  LRF:  01/20/16

## 2017-04-08 DIAGNOSIS — M5136 Other intervertebral disc degeneration, lumbar region: Secondary | ICD-10-CM | POA: Diagnosis not present

## 2017-05-09 ENCOUNTER — Other Ambulatory Visit: Payer: Self-pay | Admitting: Family Medicine

## 2017-05-09 DIAGNOSIS — F411 Generalized anxiety disorder: Secondary | ICD-10-CM

## 2017-05-10 ENCOUNTER — Other Ambulatory Visit: Payer: Self-pay | Admitting: Family Medicine

## 2017-05-10 ENCOUNTER — Ambulatory Visit (INDEPENDENT_AMBULATORY_CARE_PROVIDER_SITE_OTHER): Payer: BLUE CROSS/BLUE SHIELD | Admitting: Family Medicine

## 2017-05-10 ENCOUNTER — Encounter: Payer: Self-pay | Admitting: Family Medicine

## 2017-05-10 VITALS — BP 110/74 | HR 80 | Temp 98.2°F | Resp 16 | Ht 69.0 in | Wt 194.0 lb

## 2017-05-10 DIAGNOSIS — F411 Generalized anxiety disorder: Secondary | ICD-10-CM | POA: Diagnosis not present

## 2017-05-10 DIAGNOSIS — J209 Acute bronchitis, unspecified: Secondary | ICD-10-CM | POA: Diagnosis not present

## 2017-05-10 MED ORDER — ALPRAZOLAM 0.5 MG PO TABS: 0.5000 mg | ORAL_TABLET | Freq: Three times a day (TID) | ORAL | 2 refills | Status: DC | PRN

## 2017-05-10 MED ORDER — AZITHROMYCIN 250 MG PO TABS: ORAL_TABLET | ORAL | 0 refills | Status: DC

## 2017-05-10 MED ORDER — ESCITALOPRAM OXALATE 10 MG PO TABS: 10.0000 mg | ORAL_TABLET | Freq: Every day | ORAL | 5 refills | Status: DC

## 2017-05-10 MED ORDER — HYDROCODONE-HOMATROPINE 5-1.5 MG/5ML PO SYRP: 5.0000 mL | ORAL_SOLUTION | Freq: Three times a day (TID) | ORAL | 0 refills | Status: DC | PRN

## 2017-05-10 NOTE — Progress Notes (Signed)
Subjective:    Patient ID: Casey Stephens, male    DOB: [birth year], 57 y.o.   MRN: [id number]  HPI Patient symptoms began 1 week ago.  Symptoms include body aches, fever, chills, and cough productive of white and yellow sputum.  Symptoms were improving throughout last week but seemed to take a turn for the worse over the weekend.  This morning, the cough has worsened.  He reports pleurisy and thick productive yellow sputum when he coughs.  He denies any chest pain.  He denies any shortness of breath.  He denies any pleurisy.  He denies any nausea or vomiting. Past Medical History:  Diagnosis Date  . Allergy    Past Surgical History:  Procedure Laterality Date  . av fistula lt temple     ruptured artery from softball injury  . crushed thumb repair    . testicular biopsy     negative   Current Outpatient Medications on File Prior to Visit  Medication Sig Dispense Refill  . aspirin 81 MG tablet Take 81 mg by mouth daily.      . B-D 3CC LUER-LOK SYR 23GX1" 23G X 1" 3 ML MISC USE TO INJECT TESTOSTERONE EVERY 2 WEEKS 6 each 3  . cetirizine (ZYRTEC) 10 MG tablet Take 10 mg by mouth as needed for allergies.    . diazepam (VALIUM) 5 MG tablet Take 1 tablet (5 mg total) by mouth every 6 (six) hours as needed for anxiety (as needed for muscle spasms). 30 tablet 0  . EPINEPHrine (EPI-PEN) 0.3 mg/0.3 mL SOAJ Inject 0.3 mLs (0.3 mg total) into the muscle once. 1 Device 5  . fish oil-omega-3 fatty acids 1000 MG capsule Take 2 g by mouth daily.      Marland Kitchen testosterone cypionate (DEPOTESTOSTERONE CYPIONATE) 200 MG/ML injection INJECT 1 ML EVERY 14 DAYS AS DIRECTED 10 mL 0  . lisinopril (PRINIVIL,ZESTRIL) 10 MG tablet Take 1 tablet (10 mg total) by mouth daily. (Patient not taking: Reported on 05/10/2017) 90 tablet 3   No current facility-administered medications on file prior to visit.    No Known Allergies Social History   Socioeconomic History  . Marital status: Married    Spouse name: Not on file   . Number of children: Not on file  . Years of education: Not on file  . Highest education level: Not on file  Social Needs  . Financial resource strain: Not on file  . Food insecurity - worry: Not on file  . Food insecurity - inability: Not on file  . Transportation needs - medical: Not on file  . Transportation needs - non-medical: Not on file  Occupational History  . Not on file  Tobacco Use  . Smoking status: Former Smoker    Packs/day: 0.50    Years: 25.00    Pack years: 12.50    Types: Cigarettes    Last attempt to quit: 01/29/2013    Years since quitting: 4.2  . Smokeless tobacco: Never Used  Substance and Sexual Activity  . Alcohol use: Yes    Alcohol/week: 0.6 oz    Types: 1 Cans of beer per week  . Drug use: No  . Sexual activity: Not on file  Other Topics Concern  . Not on file  Social History Narrative  . Not on file      Review of Systems  All other systems reviewed and are negative.      Objective:   Physical Exam  Constitutional: He appears well-developed  and well-nourished.  HENT:  Right Ear: External ear normal.  Left Ear: External ear normal.  Nose: Nose normal.  Mouth/Throat: Oropharynx is clear and moist. No oropharyngeal exudate.  Eyes: Conjunctivae are normal.  Cardiovascular: Normal rate, regular rhythm and normal heart sounds. Exam reveals no gallop and no friction rub.  No murmur heard. Pulmonary/Chest: Effort normal and breath sounds normal. No respiratory distress. He has no wheezes. He has no rales.  Vitals reviewed.         Assessment & Plan:  Acute bronchitis, unspecified organism  GAD (generalized anxiety disorder) - Plan: escitalopram (LEXAPRO) 10 MG tablet, ALPRAZolam (XANAX) 0.5 MG tablet  Patient has some mild expiratory wheezes on exam that are faint and scattered.  I believe he has a viral upper respiratory infection/viral bronchitis.  I have recommended tincture of time.  I explained to the patient this is most  likely a virus and will improve over the next week.  I would treat the patient symptomatically with Hycodan 1 teaspoon every 6 hours as needed for cough.  I did give him a Z-Pak in case the symptoms worsen and he develops purulent sputum, shortness of breath, or worsening fever.

## 2017-05-10 NOTE — Telephone Encounter (Signed)
Pt is requesting refill on Xanax   LOV:  08/07/16  LRF:   05/25/16

## 2017-11-04 DIAGNOSIS — M5136 Other intervertebral disc degeneration, lumbar region: Secondary | ICD-10-CM | POA: Diagnosis not present

## 2017-11-05 ENCOUNTER — Other Ambulatory Visit: Payer: Self-pay | Admitting: Family Medicine

## 2017-11-05 DIAGNOSIS — F411 Generalized anxiety disorder: Secondary | ICD-10-CM

## 2017-12-02 ENCOUNTER — Ambulatory Visit: Payer: BLUE CROSS/BLUE SHIELD | Admitting: Family Medicine

## 2017-12-02 ENCOUNTER — Encounter: Payer: Self-pay | Admitting: Family Medicine

## 2017-12-02 VITALS — BP 142/90 | HR 80 | Temp 99.0°F | Resp 16 | Ht 69.0 in | Wt 181.0 lb

## 2017-12-02 DIAGNOSIS — E291 Testicular hypofunction: Secondary | ICD-10-CM | POA: Diagnosis not present

## 2017-12-02 DIAGNOSIS — Z832 Family history of diseases of the blood and blood-forming organs and certain disorders involving the immune mechanism: Secondary | ICD-10-CM

## 2017-12-02 DIAGNOSIS — M65331 Trigger finger, right middle finger: Secondary | ICD-10-CM | POA: Diagnosis not present

## 2017-12-02 NOTE — Progress Notes (Signed)
Subjective:    Patient ID: Casey Stephens, male    DOB: [birth year], 57 y.o.   MRN: [id number]  Medication Refill     11/2015 The patient presents reporting worsening anxiety.  Symptoms have been gradually worsening over the last few months. However it is now, almost a daily phenomenon. He reports daily anxiety. He has occasional panic attacks. He reports poor concentration. He denies any lack of energy. He denies any insomnia. He denies anhedonia. He does have some mild symptoms of depression. However the anxiety has become unbearable. The majority stems from problems at work and stress related to work. He is also due to recheck a PSA and testosterone level.  At that time, my plan was: Begin Lexapro 10 mg a day as a way to help control his anxiety. Recheck in 4 weeks. He can use Xanax temporarily 0.5 mg 1 every 8 hours as needed for anxiety until the Lexapro takes effect. I will also recheck his testosterone level and his PSA  05/25/16 Patient feels much better since starting Lexapro. He is only using Xanax 1-2 times a week for anxiety. Overall his anxiety has improved. He still uses Valium 1-2 times a week for muscle spasms in his back. He knows not to mix the 2 medications together. He is also here to recheck a testosterone level. He is currently on 200 mg IM of testosterone every 2 weeks. He states that his energy level has improved. Overall he feels better on the testosterone. He denies any side effects from the testosterone.  At that time, my plan was: Check a testosterone level along with a CBC and a PSA. If levels are within normal range, home and no changes in his testosterone dose and I will recheck it in 6 months. Continue the patient on Lexapro 10 mg a day for anxiety. I refilled the patient Xanax which he uses 1-2 tablets per week sparingly for anxiety attacks. Also refill his Valium which he uses 1-2 tablets a week sparingly as needed for muscle spasms in his back.  12/02/17 Patient  states that he felt benefit from taking test.  He has been on the medication now for more than a year.  However recently, his father developed a DVT.  During the work-up, he was found to have factor V Leiden deficiency.  Therefore the patient is concerned about possibly having a hypercoagulable condition and is concerned about continuing to take testosterone due to that.  He personally has no history of DVT or blood clot.  He also reports locking in his right third MCP joint with flexion when he makes a fist.  There is no palpable nodule on the palmar surface of the third MCP joint there is a palpable locking sensation at times when he makes a fist.    Past Medical History:  Diagnosis Date  . Allergy    Past Surgical History:  Procedure Laterality Date  . av fistula lt temple     ruptured artery from softball injury  . crushed thumb repair    . testicular biopsy     negative   Current Outpatient Medications on File Prior to Visit  Medication Sig Dispense Refill  . ALPRAZolam (XANAX) 0.5 MG tablet Take 1 tablet (0.5 mg total) by mouth 3 (three) times daily as needed. for anxiety 30 tablet 2  . aspirin 81 MG tablet Take 81 mg by mouth daily.      . B-D 3CC LUER-LOK SYR 23GX1" 23G X 1" 3 ML  MISC USE TO INJECT TESTOSTERONE EVERY 2 WEEKS 6 each 3  . cetirizine (ZYRTEC) 10 MG tablet Take 10 mg by mouth as needed for allergies.    Marland Kitchen EPINEPHrine (EPI-PEN) 0.3 mg/0.3 mL SOAJ Inject 0.3 mLs (0.3 mg total) into the muscle once. 1 Device 5  . escitalopram (LEXAPRO) 10 MG tablet TAKE ONE TABLET BY MOUTH DAILY 30 tablet 4  . fish oil-omega-3 fatty acids 1000 MG capsule Take 2 g by mouth daily.      Marland Kitchen testosterone cypionate (DEPOTESTOSTERONE CYPIONATE) 200 MG/ML injection INJECT 1 ML EVERY 14 DAYS AS DIRECTED 10 mL 0   No current facility-administered medications on file prior to visit.    No Known Allergies Social History   Socioeconomic History  . Marital status: Married    Spouse name: Not on  file  . Number of children: Not on file  . Years of education: Not on file  . Highest education level: Not on file  Occupational History  . Not on file  Social Needs  . Financial resource strain: Not on file  . Food insecurity:    Worry: Not on file    Inability: Not on file  . Transportation needs:    Medical: Not on file    Non-medical: Not on file  Tobacco Use  . Smoking status: Former Smoker    Packs/day: 0.50    Years: 25.00    Pack years: 12.50    Types: Cigarettes    Last attempt to quit: 01/29/2013    Years since quitting: 4.8  . Smokeless tobacco: Never Used  Substance and Sexual Activity  . Alcohol use: Yes    Alcohol/week: 1.0 standard drinks    Types: 1 Cans of beer per week  . Drug use: No  . Sexual activity: Not on file  Lifestyle  . Physical activity:    Days per week: Not on file    Minutes per session: Not on file  . Stress: Not on file  Relationships  . Social connections:    Talks on phone: Not on file    Gets together: Not on file    Attends religious service: Not on file    Active member of club or organization: Not on file    Attends meetings of clubs or organizations: Not on file    Relationship status: Not on file  . Intimate partner violence:    Fear of current or ex partner: Not on file    Emotionally abused: Not on file    Physically abused: Not on file    Forced sexual activity: Not on file  Other Topics Concern  . Not on file  Social History Narrative  . Not on file      Review of Systems  All other systems reviewed and are negative.      Objective:   Physical Exam  Constitutional: He appears well-developed and well-nourished.  Cardiovascular: Normal rate, regular rhythm and normal heart sounds.  Pulmonary/Chest: Effort normal and breath sounds normal.  Musculoskeletal:       Right hand: He exhibits decreased range of motion and tenderness. He exhibits no bony tenderness. Normal sensation noted. Normal strength noted.        Hands: Psychiatric: He has a normal mood and affect. His behavior is normal. Judgment and thought content normal.  Vitals reviewed.         Assessment & Plan:   Family history of factor V Leiden mutation - Plan: Factor 5 leiden  Hypogonadism  in male  Patient has a trigger finger as well  Given the family history of factor V Leiden mutation, I have recommended that we screen the patient for factor V Leiden mutation.  In addition I recommended that he discontinue testosterone and begin to take him aspirin 81 mg a day.  We have elected to discontinue testosterone as the patient does not feel that the therapeutic benefits outweigh the risk.  We discussed the treatment options for trigger finger and the patient elects to receive a cortisone injection.  Using sterile technique, the skin was prepped with Betadine on the palmar surface of the right third MCP joint and the area just adjacent to the tendon sheath was injected with a mixture of 1/2 cc of lidocaine and 1/2 cc of 40 mg/mL Kenalog.  The patient tolerated procedure well without complication.  Recommend a referral to hand surgery if persistent

## 2017-12-21 ENCOUNTER — Ambulatory Visit (INDEPENDENT_AMBULATORY_CARE_PROVIDER_SITE_OTHER): Payer: BLUE CROSS/BLUE SHIELD | Admitting: Family Medicine

## 2017-12-21 ENCOUNTER — Encounter: Payer: Self-pay | Admitting: Family Medicine

## 2017-12-21 VITALS — BP 130/90 | HR 80 | Temp 98.1°F | Resp 16 | Ht 69.0 in | Wt 180.0 lb

## 2017-12-21 DIAGNOSIS — Z23 Encounter for immunization: Secondary | ICD-10-CM | POA: Diagnosis not present

## 2017-12-21 DIAGNOSIS — Z832 Family history of diseases of the blood and blood-forming organs and certain disorders involving the immune mechanism: Secondary | ICD-10-CM

## 2017-12-21 DIAGNOSIS — Z0000 Encounter for general adult medical examination without abnormal findings: Secondary | ICD-10-CM | POA: Diagnosis not present

## 2017-12-21 NOTE — Addendum Note (Signed)
Addended by: Shary Decamp B on: 12/21/2017 03:58 PM   Modules accepted: Orders

## 2017-12-21 NOTE — Progress Notes (Signed)
Subjective:    Patient ID: Casey Stephens, male    DOB: 26-Aug-1960, 57 y.o.   MRN: [id number]   +  Medication Refill     11/2015 The patient presents reporting worsening anxiety.  Symptoms have been gradually worsening over the last few months. However it is now, almost a daily phenomenon. He reports daily anxiety. He has occasional panic attacks. He reports poor concentration. He denies any lack of energy. He denies any insomnia. He denies anhedonia. He does have some mild symptoms of depression. However the anxiety has become unbearable. The majority stems from problems at work and stress related to work. He is also due to recheck a PSA and testosterone level.  At that time, my plan was: Begin Lexapro 10 mg a day as a way to help control his anxiety. Recheck in 4 weeks. He can use Xanax temporarily 0.5 mg 1 every 8 hours as needed for anxiety until the Lexapro takes effect. I will also recheck his testosterone level and his PSA  05/25/16 Patient feels much better since starting Lexapro. He is only using Xanax 1-2 times a week for anxiety. Overall his anxiety has improved. He still uses Valium 1-2 times a week for muscle spasms in his back. He knows not to mix the 2 medications together. He is also here to recheck a testosterone level. He is currently on 200 mg IM of testosterone every 2 weeks. He states that his energy level has improved. Overall he feels better on the testosterone. He denies any side effects from the testosterone.  At that time, my plan was: Check a testosterone level along with a CBC and a PSA. If levels are within normal range, home and no changes in his testosterone dose and I will recheck it in 6 months. Continue the patient on Lexapro 10 mg a day for anxiety. I refilled the patient Xanax which he uses 1-2 tablets per week sparingly for anxiety attacks. Also refill his Valium which he uses 1-2 tablets a week sparingly as needed for muscle spasms in his  back.  12/02/17 Patient states that he felt benefit from taking test.  He has been on the medication now for more than a year.  However recently, his father developed a DVT.  During the work-up, he was found to have factor V Leiden deficiency.  Therefore the patient is concerned about possibly having a hypercoagulable condition and is concerned about continuing to take testosterone due to that.  He personally has no history of DVT or blood clot.  He also reports locking in his right third MCP joint with flexion when he makes a fist.  There is no palpable nodule on the palmar surface of the third MCP joint there is a palpable locking sensation at times when he makes a fist.  At that time, my plan was: Patient has a trigger finger as well  Given the family history of factor V Leiden mutation, I have recommended that we screen the patient for factor V Leiden mutation.  In addition I recommended that he discontinue testosterone and begin to take him aspirin 81 mg a day.  We have elected to discontinue testosterone as the patient does not feel that the therapeutic benefits outweigh the risk.  We discussed the treatment options for trigger finger and the patient elects to receive a cortisone injection.  Using sterile technique, the skin was prepped with Betadine on the palmar surface of the right third MCP joint and the area just adjacent  to the tendon sheath was injected with a mixture of 1/2 cc of lidocaine and 1/2 cc of 40 mg/mL Kenalog.  The patient tolerated procedure well without complication.  Recommend a referral to hand surgery if persistent  12/21/17 Patient is here today for a complete physical exam.  He checked with his insurance and they will cover factor V Leiden testing.  Therefore he is requesting that we perform this.  He is due for PSA for prostate cancer.  His last colonoscopy was in 2017 and is not due again until next year.  He is due for a tetanus shot as well as a flu shot.  He declines  hepatitis C and HIV screening.  Overall he is doing well with no concerns.  Past Medical History:  Diagnosis Date  . Allergy    Past Surgical History:  Procedure Laterality Date  . av fistula lt temple     ruptured artery from softball injury  . crushed thumb repair    . testicular biopsy     negative   Current Outpatient Medications on File Prior to Visit  Medication Sig Dispense Refill  . ALPRAZolam (XANAX) 0.5 MG tablet Take 1 tablet (0.5 mg total) by mouth 3 (three) times daily as needed. for anxiety 30 tablet 2  . aspirin 81 MG tablet Take 81 mg by mouth daily.      . B-D 3CC LUER-LOK SYR 23GX1" 23G X 1" 3 ML MISC USE TO INJECT TESTOSTERONE EVERY 2 WEEKS 6 each 3  . cetirizine (ZYRTEC) 10 MG tablet Take 10 mg by mouth as needed for allergies.    Marland Kitchen EPINEPHrine (EPI-PEN) 0.3 mg/0.3 mL SOAJ Inject 0.3 mLs (0.3 mg total) into the muscle once. 1 Device 5  . escitalopram (LEXAPRO) 10 MG tablet TAKE ONE TABLET BY MOUTH DAILY 30 tablet 4  . fish oil-omega-3 fatty acids 1000 MG capsule Take 2 g by mouth daily.      Marland Kitchen testosterone cypionate (DEPOTESTOSTERONE CYPIONATE) 200 MG/ML injection INJECT 1 ML EVERY 14 DAYS AS DIRECTED 10 mL 0   No current facility-administered medications on file prior to visit.    No Known Allergies Social History   Socioeconomic History  . Marital status: Married    Spouse name: Not on file  . Number of children: Not on file  . Years of education: Not on file  . Highest education level: Not on file  Occupational History  . Not on file  Social Needs  . Financial resource strain: Not on file  . Food insecurity:    Worry: Not on file    Inability: Not on file  . Transportation needs:    Medical: Not on file    Non-medical: Not on file  Tobacco Use  . Smoking status: Former Smoker    Packs/day: 0.50    Years: 25.00    Pack years: 12.50    Types: Cigarettes    Last attempt to quit: 01/29/2013    Years since quitting: 4.8  . Smokeless tobacco:  Never Used  Substance and Sexual Activity  . Alcohol use: Yes    Alcohol/week: 1.0 standard drinks    Types: 1 Cans of beer per week  . Drug use: No  . Sexual activity: Not on file  Lifestyle  . Physical activity:    Days per week: Not on file    Minutes per session: Not on file  . Stress: Not on file  Relationships  . Social connections:    Talks on  phone: Not on file    Gets together: Not on file    Attends religious service: Not on file    Active member of club or organization: Not on file    Attends meetings of clubs or organizations: Not on file    Relationship status: Not on file  . Intimate partner violence:    Fear of current or ex partner: Not on file    Emotionally abused: Not on file    Physically abused: Not on file    Forced sexual activity: Not on file  Other Topics Concern  . Not on file  Social History Narrative  . Not on file      Review of Systems  All other systems reviewed and are negative.      Objective:   Physical Exam  Constitutional: He is oriented to person, place, and time. He appears well-developed and well-nourished. No distress.  HENT:  Head: Normocephalic and atraumatic.  Right Ear: External ear normal.  Left Ear: External ear normal.  Nose: Nose normal.  Mouth/Throat: Oropharynx is clear and moist. No oropharyngeal exudate.  Eyes: Pupils are equal, round, and reactive to light. Conjunctivae and EOM are normal. Right eye exhibits no discharge. Left eye exhibits no discharge. No scleral icterus.  Neck: Normal range of motion. Neck supple. No JVD present. No tracheal deviation present. No thyromegaly present.  Cardiovascular: Normal rate, regular rhythm, normal heart sounds and intact distal pulses. Exam reveals no gallop and no friction rub.  No murmur heard. Pulmonary/Chest: Effort normal and breath sounds normal. No stridor. No respiratory distress. He has no wheezes. He has no rales. He exhibits no tenderness.  Abdominal: Soft.  Bowel sounds are normal. He exhibits no distension and no mass. There is no tenderness. There is no rebound and no guarding.  Genitourinary: Rectum normal, prostate normal and penis normal. Prostate is not enlarged and not tender.  Musculoskeletal: He exhibits no edema or tenderness.  Lymphadenopathy:    He has no cervical adenopathy.  Neurological: He is alert and oriented to person, place, and time. He displays normal reflexes. No cranial nerve deficit. He exhibits normal muscle tone. Coordination normal.  Skin: Skin is warm. No rash noted. He is not diaphoretic. No erythema. No pallor.  Psychiatric: He has a normal mood and affect. His behavior is normal. Judgment and thought content normal.  Vitals reviewed.         Assessment & Plan:  Family history of factor V Leiden mutation - Plan: Factor 5 leiden  General medical exam - Plan: CBC with Differential/Platelet, Lipid panel, COMPLETE METABOLIC PANEL WITH GFR, PSA  Physical exam today is completely normal.  His blood pressure is acceptable at 130/90.  He received his flu shot today.  He also received his tetanus vaccine today.  He politely declines HIV and hepatitis C screening.  I will screen the patient for factor V Leiden mutation as requested due to his family history and his father who recently had a pulmonary embolism.  Colonoscopy is up-to-date until next year.  Prostate exam is normal today however I will also check a PSA.  I will also screen with a CBC, fasting lipid panel, and CMP.

## 2017-12-22 LAB — COMPLETE METABOLIC PANEL WITH GFR
AG Ratio: 1.9 (calc) (ref 1.0–2.5)
ALBUMIN MSPROF: 4.5 g/dL (ref 3.6–5.1)
ALKALINE PHOSPHATASE (APISO): 63 U/L (ref 40–115)
ALT: 22 U/L (ref 9–46)
AST: 19 U/L (ref 10–35)
BUN: 10 mg/dL (ref 7–25)
CALCIUM: 9.6 mg/dL (ref 8.6–10.3)
CO2: 23 mmol/L (ref 20–32)
CREATININE: 0.99 mg/dL (ref 0.70–1.33)
Chloride: 105 mmol/L (ref 98–110)
GFR, EST NON AFRICAN AMERICAN: 84 mL/min/{1.73_m2} (ref >=60)
GFR, Est African American: 98 mL/min/{1.73_m2} (ref >=60)
GLUCOSE: 78 mg/dL (ref 65–99)
Globulin: 2.4 g/dL (calc) (ref 1.9–3.7)
Potassium: 4.1 mmol/L (ref 3.5–5.3)
Sodium: 139 mmol/L (ref 135–146)
Total Bilirubin: 0.6 mg/dL (ref 0.2–1.2)
Total Protein: 6.9 g/dL (ref 6.1–8.1)

## 2017-12-22 LAB — CBC WITH DIFFERENTIAL/PLATELET
BASOS PCT: 1.2 %
Basophils Absolute: 91 cells/uL (ref 0–200)
EOS PCT: 1.1 %
Eosinophils Absolute: 84 cells/uL (ref 15–500)
HCT: 46 % (ref 38.5–50.0)
Hemoglobin: 16.1 g/dL (ref 13.2–17.1)
Lymphs Abs: 2402 cells/uL (ref 850–3900)
MCH: 31.3 pg (ref 27.0–33.0)
MCHC: 35 g/dL (ref 32.0–36.0)
MCV: 89.5 fL (ref 80.0–100.0)
MONOS PCT: 9.4 %
MPV: 10.8 fL (ref 7.5–12.5)
NEUTROS PCT: 56.7 %
Neutro Abs: 4309 cells/uL (ref 1500–7800)
PLATELETS: 245 10*3/uL (ref 140–400)
RBC: 5.14 10*6/uL (ref 4.20–5.80)
RDW: 12.3 % (ref 11.0–15.0)
TOTAL LYMPHOCYTE: 31.6 %
WBC mixed population: 714 cells/uL (ref 200–950)
WBC: 7.6 10*3/uL (ref 3.8–10.8)

## 2017-12-22 LAB — LIPID PANEL
Cholesterol: 178 mg/dL (ref <200)
HDL: 62 mg/dL (ref >40)
LDL CHOLESTEROL (CALC): 85 mg/dL
Non-HDL Cholesterol (Calc): 116 mg/dL (calc) (ref <130)
Total CHOL/HDL Ratio: 2.9 (calc) (ref <5.0)
Triglycerides: 224 mg/dL — ABNORMAL HIGH (ref <150)

## 2017-12-22 LAB — PSA: PSA: 0.5 ng/mL (ref <=4.0)

## 2017-12-23 ENCOUNTER — Encounter: Payer: Self-pay | Admitting: Family Medicine

## 2017-12-23 DIAGNOSIS — F411 Generalized anxiety disorder: Secondary | ICD-10-CM

## 2017-12-24 MED ORDER — ALPRAZOLAM 0.5 MG PO TABS: 0.5000 mg | ORAL_TABLET | Freq: Three times a day (TID) | ORAL | 2 refills | Status: DC | PRN

## 2017-12-24 NOTE — Telephone Encounter (Signed)
Ok to refill??  Last office visit 12/21/2017.  Last refill 05/10/2017, #2 refills.

## 2017-12-25 LAB — FACTOR 5 LEIDEN

## 2017-12-27 ENCOUNTER — Encounter: Payer: Self-pay | Admitting: *Deleted

## 2018-04-13 ENCOUNTER — Other Ambulatory Visit: Payer: Self-pay | Admitting: Family Medicine

## 2018-04-13 DIAGNOSIS — F411 Generalized anxiety disorder: Secondary | ICD-10-CM

## 2018-08-18 ENCOUNTER — Other Ambulatory Visit: Payer: Self-pay | Admitting: Family Medicine

## 2018-08-18 DIAGNOSIS — F411 Generalized anxiety disorder: Secondary | ICD-10-CM

## 2018-08-25 ENCOUNTER — Encounter: Payer: Self-pay | Admitting: Internal Medicine

## 2018-09-19 ENCOUNTER — Other Ambulatory Visit: Payer: Self-pay | Admitting: Family Medicine

## 2018-09-19 DIAGNOSIS — F411 Generalized anxiety disorder: Secondary | ICD-10-CM

## 2018-09-19 NOTE — Telephone Encounter (Signed)
Ok to refill??  Last office visit 12/21/2017.  Last refill 12/24/2017, #2 refills.

## 2018-10-26 ENCOUNTER — Other Ambulatory Visit: Payer: Self-pay

## 2018-10-27 ENCOUNTER — Ambulatory Visit (INDEPENDENT_AMBULATORY_CARE_PROVIDER_SITE_OTHER): Payer: BC Managed Care – PPO | Admitting: Family Medicine

## 2018-10-27 ENCOUNTER — Encounter: Payer: Self-pay | Admitting: Family Medicine

## 2018-10-27 VITALS — BP 142/94 | HR 92 | Temp 99.1°F | Resp 16 | Ht 69.0 in | Wt 175.0 lb

## 2018-10-27 DIAGNOSIS — Z23 Encounter for immunization: Secondary | ICD-10-CM

## 2018-10-27 DIAGNOSIS — R030 Elevated blood-pressure reading, without diagnosis of hypertension: Secondary | ICD-10-CM

## 2018-10-27 NOTE — Progress Notes (Signed)
Subjective:    Patient ID: Casey Stephens, male    DOB: 1960/05/05, 58 y.o.   MRN: [id number]  Patient's blood pressure at home has been running high recently.  He has been checking his blood pressure over the last 6 weeks and has seen his blood pressure fluctuate between 130 and 150/80-90.  He denies any chest pain, shortness of breath, dyspnea on exertion. Past Medical History:  Diagnosis Date  . Allergy    Past Surgical History:  Procedure Laterality Date  . av fistula lt temple     ruptured artery from softball injury  . crushed thumb repair    . testicular biopsy     negative   Current Outpatient Medications on File Prior to Visit  Medication Sig Dispense Refill  . ALPRAZolam (XANAX) 0.5 MG tablet TAKE 1 TABLET BY MOUTH THREE TIMES A DAY DAILY AS NEEDED FOR ANXIETY 30 tablet 1  . aspirin 81 MG tablet Take 81 mg by mouth daily.      . B-D 3CC LUER-LOK SYR 23GX1" 23G X 1" 3 ML MISC USE TO INJECT TESTOSTERONE EVERY 2 WEEKS 6 each 3  . cetirizine (ZYRTEC) 10 MG tablet Take 10 mg by mouth as needed for allergies.    Marland Kitchen EPINEPHrine (EPI-PEN) 0.3 mg/0.3 mL SOAJ Inject 0.3 mLs (0.3 mg total) into the muscle once. 1 Device 5  . escitalopram (LEXAPRO) 10 MG tablet TAKE ONE TABLET BY MOUTH DAILY 30 tablet 0  . fish oil-omega-3 fatty acids 1000 MG capsule Take 2 g by mouth daily.       No current facility-administered medications on file prior to visit.    No Known Allergies Social History   Socioeconomic History  . Marital status: Married    Spouse name: Not on file  . Number of children: Not on file  . Years of education: Not on file  . Highest education level: Not on file  Occupational History  . Not on file  Social Needs  . Financial resource strain: Not on file  . Food insecurity    Worry: Not on file    Inability: Not on file  . Transportation needs    Medical: Not on file    Non-medical: Not on file  Tobacco Use  . Smoking status: Former Smoker    Packs/day: 0.50     Years: 25.00    Pack years: 12.50    Types: Cigarettes    Quit date: 01/29/2013    Years since quitting: 5.7  . Smokeless tobacco: Never Used  Substance and Sexual Activity  . Alcohol use: Yes    Alcohol/week: 1.0 standard drinks    Types: 1 Cans of beer per week  . Drug use: No  . Sexual activity: Not on file  Lifestyle  . Physical activity    Days per week: Not on file    Minutes per session: Not on file  . Stress: Not on file  Relationships  . Social Herbalist on phone: Not on file    Gets together: Not on file    Attends religious service: Not on file    Active member of club or organization: Not on file    Attends meetings of clubs or organizations: Not on file    Relationship status: Not on file  . Intimate partner violence    Fear of current or ex partner: Not on file    Emotionally abused: Not on file    Physically abused: Not  on file    Forced sexual activity: Not on file  Other Topics Concern  . Not on file  Social History Narrative  . Not on file      Review of Systems  All other systems reviewed and are negative.      Objective:   Physical Exam  Constitutional: He appears well-developed and well-nourished. No distress.  Cardiovascular: Normal rate, regular rhythm, normal heart sounds and intact distal pulses. Exam reveals no gallop and no friction rub.  No murmur heard. Pulmonary/Chest: Effort normal and breath sounds normal. No respiratory distress. He has no wheezes. He has no rales. He exhibits no tenderness.  Genitourinary: Prostate is not enlarged and not tender.  Skin: Skin is warm. No rash noted. He is not diaphoretic. No erythema. No pallor.  Psychiatric: He has a normal mood and affect. His behavior is normal. Judgment and thought content normal.  Vitals reviewed.         Assessment & Plan:  Elevated blood pressure reading - Plan: BASIC METABOLIC PANEL WITH GFR  Today's exam is completely normal.  Patient will try to  reduce the salt in his diet and exercise 30 minutes a day 5 days a week.  He will notify me of his blood pressures in 6 weeks.  If persistently elevated I would start losartan 50 mg a day.  I will obtain a BMP today to get a baseline creatinine and potassium level

## 2018-10-27 NOTE — Addendum Note (Signed)
Addended by: Shary Decamp B on: 10/27/2018 04:03 PM   Modules accepted: Orders

## 2018-10-28 LAB — BASIC METABOLIC PANEL WITH GFR
BUN: 13 mg/dL (ref 7–25)
CO2: 23 mmol/L (ref 20–32)
Calcium: 9.7 mg/dL (ref 8.6–10.3)
Chloride: 106 mmol/L (ref 98–110)
Creat: 1 mg/dL (ref 0.70–1.33)
GFR, Est African American: 96 mL/min/{1.73_m2} (ref >=60)
GFR, Est Non African American: 83 mL/min/{1.73_m2} (ref >=60)
Glucose, Bld: 143 mg/dL — ABNORMAL HIGH (ref 65–99)
Potassium: 3.8 mmol/L (ref 3.5–5.3)
Sodium: 141 mmol/L (ref 135–146)

## 2018-11-08 ENCOUNTER — Other Ambulatory Visit: Payer: Self-pay | Admitting: Family Medicine

## 2018-11-08 DIAGNOSIS — F411 Generalized anxiety disorder: Secondary | ICD-10-CM

## 2018-11-09 ENCOUNTER — Encounter: Payer: Self-pay | Admitting: Family Medicine

## 2018-11-09 DIAGNOSIS — F411 Generalized anxiety disorder: Secondary | ICD-10-CM

## 2018-11-09 MED ORDER — ESCITALOPRAM OXALATE 10 MG PO TABS: 10.0000 mg | ORAL_TABLET | Freq: Every day | ORAL | 3 refills | Status: DC

## 2019-02-17 ENCOUNTER — Other Ambulatory Visit: Payer: Self-pay

## 2019-02-17 DIAGNOSIS — Z20822 Contact with and (suspected) exposure to covid-19: Secondary | ICD-10-CM

## 2019-02-20 LAB — NOVEL CORONAVIRUS, NAA: SARS-CoV-2, NAA: NOT DETECTED

## 2019-05-19 ENCOUNTER — Other Ambulatory Visit: Payer: Self-pay | Admitting: Family Medicine

## 2019-05-19 DIAGNOSIS — F411 Generalized anxiety disorder: Secondary | ICD-10-CM

## 2019-05-19 NOTE — Telephone Encounter (Signed)
Ok to refill??  Last office visit 10/27/2018  Last refill 09/19/2018, #1 refill.

## 2019-09-12 ENCOUNTER — Other Ambulatory Visit: Payer: Self-pay

## 2019-09-12 DIAGNOSIS — N23 Unspecified renal colic: Secondary | ICD-10-CM | POA: Insufficient documentation

## 2019-09-12 DIAGNOSIS — Z87891 Personal history of nicotine dependence: Secondary | ICD-10-CM | POA: Insufficient documentation

## 2019-09-12 DIAGNOSIS — R1031 Right lower quadrant pain: Secondary | ICD-10-CM | POA: Diagnosis present

## 2019-09-12 LAB — COMPREHENSIVE METABOLIC PANEL
ALT: 28 U/L (ref 0–44)
AST: 26 U/L (ref 15–41)
Albumin: 4.5 g/dL (ref 3.5–5.0)
Alkaline Phosphatase: 76 U/L (ref 38–126)
Anion gap: 9 (ref 5–15)
BUN: 16 mg/dL (ref 6–20)
CO2: 24 mmol/L (ref 22–32)
Calcium: 9.4 mg/dL (ref 8.9–10.3)
Chloride: 108 mmol/L (ref 98–111)
Creatinine, Ser: 1.19 mg/dL (ref 0.61–1.24)
GFR calc Af Amer: >60 mL/min (ref >60)
GFR calc non Af Amer: >60 mL/min (ref >60)
Glucose, Bld: 110 mg/dL — ABNORMAL HIGH (ref 70–99)
Potassium: 3.7 mmol/L (ref 3.5–5.1)
Sodium: 141 mmol/L (ref 135–145)
Total Bilirubin: 1.3 mg/dL — ABNORMAL HIGH (ref 0.3–1.2)
Total Protein: 7.7 g/dL (ref 6.5–8.1)

## 2019-09-12 LAB — URINALYSIS, COMPLETE (UACMP) WITH MICROSCOPIC
Bacteria, UA: NONE SEEN
Bilirubin Urine: NEGATIVE
Glucose, UA: NEGATIVE mg/dL
Ketones, ur: 5 mg/dL — AB
Leukocytes,Ua: NEGATIVE
Nitrite: NEGATIVE
Protein, ur: NEGATIVE mg/dL
RBC / HPF: >50 RBC/hpf — ABNORMAL HIGH (ref 0–5)
Specific Gravity, Urine: 1.027 (ref 1.005–1.030)
Squamous Epithelial / HPF: NONE SEEN (ref 0–5)
pH: 5 (ref 5.0–8.0)

## 2019-09-12 LAB — CBC
HCT: 43.5 % (ref 39.0–52.0)
Hemoglobin: 15.4 g/dL (ref 13.0–17.0)
MCH: 31.1 pg (ref 26.0–34.0)
MCHC: 35.4 g/dL (ref 30.0–36.0)
MCV: 87.9 fL (ref 80.0–100.0)
Platelets: 246 10*3/uL (ref 150–400)
RBC: 4.95 MIL/uL (ref 4.22–5.81)
RDW: 12.2 % (ref 11.5–15.5)
WBC: 10.3 10*3/uL (ref 4.0–10.5)
nRBC: 0 % (ref 0.0–0.2)

## 2019-09-12 LAB — LIPASE, BLOOD: Lipase: 44 U/L (ref 11–51)

## 2019-09-12 MED ORDER — SODIUM CHLORIDE 0.9% FLUSH: 3.0000 mL | Freq: Once | INTRAVENOUS | Status: DC

## 2019-09-12 MED ORDER — OXYCODONE-ACETAMINOPHEN 5-325 MG PO TABS
1.0000 | ORAL_TABLET | ORAL | Status: DC | PRN
  Administered 2019-09-12: 1 via ORAL
  Filled 2019-09-12: qty 1

## 2019-09-12 NOTE — ED Triage Notes (Signed)
Pt reporting sharp RLQ pain and right groin pain x 3 weeks. Pt started intermittently but has worsened recently. Blood tinged urine noted today. Hx of kidney stones but pt reports this does not feel the same.

## 2019-09-13 ENCOUNTER — Emergency Department
Admission: EM | Admit: 2019-09-13 | Discharge: 2019-09-13 | Disposition: A | Discharge status: Home / Self Care | Payer: BLUE CROSS/BLUE SHIELD | Attending: Emergency Medicine | Admitting: Emergency Medicine

## 2019-09-13 ENCOUNTER — Emergency Department: Payer: BLUE CROSS/BLUE SHIELD

## 2019-09-13 DIAGNOSIS — N23 Unspecified renal colic: Secondary | ICD-10-CM

## 2019-09-13 MED ORDER — SODIUM CHLORIDE 0.9 % IV BOLUS
1000.0000 mL | Freq: Once | INTRAVENOUS | Status: AC
  Administered 2019-09-13: 1000 mL via INTRAVENOUS

## 2019-09-13 MED ORDER — OXYCODONE-ACETAMINOPHEN 10-325 MG PO TABS: 0.5000 | ORAL_TABLET | ORAL | 0 refills | Status: DC | PRN

## 2019-09-13 MED ORDER — MORPHINE SULFATE (PF) 4 MG/ML IV SOLN
4.0000 mg | Freq: Once | INTRAVENOUS | Status: AC
  Administered 2019-09-13: 4 mg via INTRAVENOUS
  Filled 2019-09-13: qty 1

## 2019-09-13 MED ORDER — ONDANSETRON HCL 4 MG/2ML IJ SOLN
4.0000 mg | Freq: Once | INTRAMUSCULAR | Status: AC
  Administered 2019-09-13: 4 mg via INTRAVENOUS
  Filled 2019-09-13: qty 2

## 2019-09-13 MED ORDER — TAMSULOSIN HCL 0.4 MG PO CAPS
0.4000 mg | ORAL_CAPSULE | Freq: Every day | ORAL | 0 refills | Status: DC
Start: 2019-09-13 — End: 2020-08-06

## 2019-09-13 MED ORDER — ONDANSETRON 4 MG PO TBDP
4.0000 mg | ORAL_TABLET | Freq: Three times a day (TID) | ORAL | 0 refills | Status: DC | PRN
Start: 2019-09-13 — End: 2020-08-06

## 2019-09-13 MED ORDER — TAMSULOSIN HCL 0.4 MG PO CAPS
0.4000 mg | ORAL_CAPSULE | Freq: Once | ORAL | Status: AC
  Administered 2019-09-13: 0.4 mg via ORAL
  Filled 2019-09-13: qty 1

## 2019-09-13 NOTE — Discharge Instructions (Signed)
1. Take pain & nausea medicines as needed (Percocet/Zofran #30). Make sure to take a stool softener while taking narcotic pain medicines. 2. Take Flomax 0.4mg daily x 14 days. 3. Drink plenty of bottled or filtered water daily. 4. Return to the ER for worsening symptoms, persistent vomiting, fever, difficulty breathing or other concerns.  

## 2019-09-13 NOTE — ED Provider Notes (Signed)
[facility] Emergency Department Provider Note   ____________________________________________   First MD Initiated Contact with Patient 09/13/19 [phone]     (approximate)  I have reviewed the triage vital signs and the nursing notes.   HISTORY  Chief Complaint Abdominal Pain    HPI Casey Stephens is a 59 y.o. male who presents to the ED from home with a chief complaint of right lower quadrant/groin pain.  Patient reports pain waxing/waning for the past 3 weeks.  Acutely intensified last evening with associated blood-tinged urine and nausea.  History of kidney stones not requiring lithotripsy or stenting.  Last kidney stone approximately 20 years ago.  Denies fever, chills, cough, chest pain, shortness of breath, flank pain, vomiting, testicular pain/swelling, or diarrhea.  Denies recent travel trauma.  Takes a baby aspirin daily.       Past Medical History:  Diagnosis Date  . Allergy     There are no problems to display for this patient.   Past Surgical History:  Procedure Laterality Date  . av fistula lt temple     ruptured artery from softball injury  . crushed thumb repair    . testicular biopsy     negative    Prior to Admission medications   Medication Sig Start Date End Date Taking? Authorizing Provider  ALPRAZolam Duanne Moron) 0.5 MG tablet TAKE ONE TABLET BY MOUTH THREE TIMES A DAY AS NEEDED FOR ANXIETY 05/19/19   Susy Frizzle, MD  aspirin 81 MG tablet Take 81 mg by mouth daily.      [provider]  B-D 3CC LUER-LOK SYR 23GX1" 23G X 1" 3 ML MISC USE TO INJECT TESTOSTERONE EVERY 2 WEEKS    Pickard, Cammie Mcgee, MD  cetirizine (ZYRTEC) 10 MG tablet Take 10 mg by mouth as needed for allergies.    [provider]  EPINEPHrine (EPI-PEN) 0.3 mg/0.3 mL SOAJ Inject 0.3 mLs (0.3 mg total) into the muscle once. 10/04/12   Susy Frizzle, MD  escitalopram (LEXAPRO) 10 MG tablet Take 1 tablet (10 mg total) by mouth daily. 11/09/18    Susy Frizzle, MD  fish oil-omega-3 fatty acids 1000 MG capsule Take 2 g by mouth daily.      [provider]  ondansetron (ZOFRAN ODT) 4 MG disintegrating tablet Take 1 tablet (4 mg total) by mouth every 8 (eight) hours as needed for nausea or vomiting. 09/13/19   Paulette Blanch, MD  oxyCODONE-acetaminophen (PERCOCET) 10-325 MG tablet Take 0.5 tablets by mouth every 4 (four) hours as needed for pain. 09/13/19   Paulette Blanch, MD  tamsulosin (FLOMAX) 0.4 MG CAPS capsule Take 1 capsule (0.4 mg total) by mouth daily. 09/13/19   Paulette Blanch, MD    Allergies Patient has no known allergies.  Family History  Problem Relation Age of Onset  . Diabetes Father   . Factor V Leiden deficiency Father   . Colon cancer Neg Hx     Social History Social History   Tobacco Use  . Smoking status: Former Smoker    Packs/day: 0.50    Years: 25.00    Pack years: 12.50    Types: Cigarettes    Quit date: 01/29/2013    Years since quitting: 6.6  . Smokeless tobacco: Never Used  Substance Use Topics  . Alcohol use: Yes    Alcohol/week: 1.0 standard drink    Types: 1 Cans of beer per week  . Drug use: No  Review of Systems  Constitutional: No fever/chills Eyes: No visual changes. ENT: No sore throat. Cardiovascular: Denies chest pain. Respiratory: Denies shortness of breath. Gastrointestinal: Positive for right lower quadrant abdominal pain.  Positive for nausea, no vomiting.  No diarrhea.  No constipation. Genitourinary: Positive for hematuria.  Negative for dysuria. Musculoskeletal: Negative for back pain. Skin: Negative for rash. Neurological: Negative for headaches, focal weakness or numbness.   ____________________________________________   PHYSICAL EXAM:  VITAL SIGNS: ED Triage Vitals  Enc Vitals Group     BP 09/12/19 2105 (!) 156/97     Pulse Rate 09/12/19 2105 66     Resp 09/12/19 2105 20     Temp 09/12/19 2105 97.8 F (36.6 C)     Temp Source 09/12/19 2105 Oral      SpO2 09/12/19 2105 96 %     Weight 09/12/19 2106 185 lb (83.9 kg)     Height 09/12/19 2106 5\' 9"  (1.753 m)     Head Circumference --      Peak Flow --      Pain Score 09/12/19 2113 7     Pain Loc --      Pain Edu? --      Excl. in [place]? --     Constitutional: Alert and oriented. Well appearing and in no acute distress. Eyes: Conjunctivae are normal. PERRL. EOMI. Head: Atraumatic. Nose: No congestion/rhinnorhea. Mouth/Throat: Mucous membranes are moist.   Neck: No stridor.   Cardiovascular: Normal rate, regular rhythm. Grossly normal heart sounds.  Good peripheral circulation. Respiratory: Normal respiratory effort.  No retractions. Lungs CTAB. Gastrointestinal: Soft and mildly tender to palpation right lower quadrant without rebound or guarding. No distention. No abdominal bruits. No CVA tenderness. Musculoskeletal: No lower extremity tenderness nor edema.  No joint effusions. Neurologic:  Normal speech and language. No gross focal neurologic deficits are appreciated. No gait instability. Skin:  Skin is warm, dry and intact. No rash noted. Psychiatric: Mood and affect are normal. Speech and behavior are normal.  ____________________________________________   LABS (all labs ordered are listed, but only abnormal results are displayed)  Labs Reviewed  COMPREHENSIVE METABOLIC PANEL - Abnormal; Notable for the following components:      Result Value   Glucose, Bld 110 (*)    Total Bilirubin 1.3 (*)    All other components within normal limits  URINALYSIS, COMPLETE (UACMP) WITH MICROSCOPIC - Abnormal; Notable for the following components:   Color, Urine YELLOW (*)    APPearance HAZY (*)    Hgb urine dipstick SMALL (*)    Ketones, ur 5 (*)    RBC / HPF >50 (*)    All other components within normal limits  LIPASE, BLOOD  CBC   ____________________________________________  EKG  None ____________________________________________  RADIOLOGY  ED MD interpretation: 6-7 mm  proximal right ureteral stone with mild hydronephrosis  Official radiology report(s): CT Renal Stone Study  Result Date: 09/13/2019 CLINICAL DATA:  Right-sided flank and groin pain, initial encounter EXAM: CT ABDOMEN AND PELVIS WITHOUT CONTRAST TECHNIQUE: Multidetector CT imaging of the abdomen and pelvis was performed following the standard protocol without IV contrast. COMPARISON:  09/04/2014 FINDINGS: Lower chest: No acute abnormality. Hepatobiliary: No focal liver abnormality is seen. No gallstones, gallbladder wall thickening, or biliary dilatation. Pancreas: Unremarkable. No pancreatic ductal dilatation or surrounding inflammatory changes. Spleen: Normal in size without focal abnormality. Adrenals/Urinary Tract: Adrenal glands are within normal limits. Kidneys are well visualized bilaterally. Left kidney demonstrates a tiny nonobstructing stone. The right kidney  demonstrates mild hydronephrosis. There is a 6-7 mm stone identified in the proximal right ureter causing the obstructive change. The more distal right ureter is within normal limits. The bladder is partially distended. Stomach/Bowel: The appendix is within normal limits. Diverticulosis of the colon is noted without evidence of diverticulitis. No small bowel or gastric abnormality is noted. Vascular/Lymphatic: Aortic atherosclerosis. No enlarged abdominal or pelvic lymph nodes. Reproductive: Prostate is unremarkable. Other: No abdominal wall hernia or abnormality. No abdominopelvic ascites. Musculoskeletal: Degenerative changes of lumbar spine are noted. IMPRESSION: Proximal right ureteral stone as described with obstructive change. Tiny nonobstructing left renal stone. Diverticulosis without diverticulitis. Electronically Signed   By: Inez Catalina M.D.   On: 09/13/2019 02:11    ____________________________________________   PROCEDURES  Procedure(s) performed (including Critical  Care):  Procedures   ____________________________________________   INITIAL IMPRESSION / ASSESSMENT AND PLAN / ED COURSE  As part of my medical decision making, I reviewed the following data within the [place] notes reviewed and incorporated, Labs reviewed, Old chart reviewed, Radiograph reviewed, Notes from prior ED visits and [place] Controlled Substance Database     Casey Stephens was evaluated in Emergency Department on 09/13/2019 for the symptoms described in the history of present illness. He was evaluated in the context of the global COVID-19 pandemic, which necessitated consideration that the patient might be at risk for infection with the SARS-CoV-2 virus that causes COVID-19. Institutional protocols and algorithms that pertain to the evaluation of patients at risk for COVID-19 are in a state of rapid change based on information released by regulatory bodies including the CDC and federal and state organizations. These policies and algorithms were followed during the patient's care in the ED.    59 year old male presenting with right lower quadrant abdominal pain. Differential diagnosis includes, but is not limited to, acute appendicitis, renal colic, testicular torsion, urinary tract infection/pyelonephritis, prostatitis,  epididymitis, diverticulitis, small bowel obstruction or ileus, colitis, abdominal aortic aneurysm, gastroenteritis, hernia, etc.  Laboratory and urinalysis results unremarkable except for mild ketonuria and microscopic hematuria.  CT demonstrates 6 to 7 mm proximal right ureteral stone.  Patient had Percocet prior to being roomed and currently rates his pain as 3/10, down from original.  Will infuse IV fluids, IV morphine for pain paired with IV Zofran for nausea.  Start Flomax.  Will discuss with urology.  Clinical Course as of Sep 13 707  Wed Sep 13, 2019  3220 Discussed with urology on-call Dr. Erlene Quan who will see patient in the office  today or tomorrow.  Will discharge home on Percocet, Zofran and Flomax.  Strict return precautions given.  Patient verbalizes understanding and agrees with plan of care.   [JS]    Clinical Course User Index [JS] Paulette Blanch, MD     ____________________________________________   FINAL CLINICAL IMPRESSION(S) / ED DIAGNOSES  Final diagnoses:  Ureteral colic     ED Discharge Orders         Ordered    ondansetron (ZOFRAN ODT) 4 MG disintegrating tablet  Every 8 hours PRN     Discontinue  Reprint     09/13/19 0648    tamsulosin (FLOMAX) 0.4 MG CAPS capsule  Daily     Discontinue  Reprint     09/13/19 0648    oxyCODONE-acetaminophen (PERCOCET) 10-325 MG tablet  Every 4 hours PRN     Discontinue  Reprint     09/13/19 2542  Note:  This document was prepared using Dragon voice recognition software and may include unintentional dictation errors.   Paulette Blanch, MD 09/13/19 [phone]

## 2019-09-14 NOTE — Progress Notes (Signed)
09/15/2019 2:52 PM   Casey Stephens 17-Dec-1960 [id number]  Referring provider: Susy Frizzle, MD [address] [place],  [place] [address] Chief Complaint  Patient presents with  . New Patient (Initial Visit)    ER follow-up    HPI: Casey Stephens is a 59 y.o. male with abdominal pain who is seen for evaluation and management of an obstructing ureteral calculus.  The patient was seen in the ED on 09/13/2019 for abdominal pain. Pain was located in the right lower quadrant with associated groin pain. Patient reported pain was waxing/waning for the past 3 weeks.  Acutely intensified last evening with associated blood-tinged urine and nausea. Denied testicular pain/swelling.  UA showed microscopic hematuria, 5 Ketones, RBC/HPF >50.   Renal CT showed proximal right ureteral stone 6-7 mm as described with obstructive change. Tiny nonobstructing left renal stone. Diverticulosis without diverticulitis.  Patient received infuse IV fluids, IV morphine for pain paired with IV Zofran for nausea.  He was started Flomax.   Patient has a history of kidney stones not requiring lithotripsy or stenting.  Last kidney stone approximately 20 years ago.  He is taking a baby aspirin daily.  Symptomatically, the patient is in pain. The pain is located in the right lower quadrant with associated groin pain.  He feels like he has to lie down in order to make the pain better.  He he is in acute discomfort today in the office.  No fevers or chills.  He has no heart conditions.   PMH: Past Medical History:  Diagnosis Date  . Allergy     Surgical History: Past Surgical History:  Procedure Laterality Date  . av fistula lt temple     ruptured artery from softball injury  . crushed thumb repair    . testicular biopsy     negative    Home Medications:  Allergies as of 09/15/2019   No Known Allergies     Medication List       Accurate as of September 15, 2019  2:52 PM. If you have any  questions, ask your nurse or doctor.        STOP taking these medications   B-D 3CC LUER-LOK SYR 23GX1" 23G X 1" 3 ML Misc Generic drug: SYRINGE-NEEDLE (DISP) 3 ML Stopped by: Hollice Espy, MD   EPINEPHrine 0.3 mg/0.3 mL Soaj injection Commonly known as: EPI-PEN Stopped by: Hollice Espy, MD     TAKE these medications   ALPRAZolam 0.5 MG tablet Commonly known as: XANAX TAKE ONE TABLET BY MOUTH THREE TIMES A DAY AS NEEDED FOR ANXIETY   aspirin 81 MG tablet Take 81 mg by mouth daily.   cetirizine 10 MG tablet Commonly known as: ZYRTEC Take 10 mg by mouth as needed for allergies.   escitalopram 10 MG tablet Commonly known as: LEXAPRO Take 1 tablet (10 mg total) by mouth daily.   fish oil-omega-3 fatty acids 1000 MG capsule Take 2 g by mouth daily.   ondansetron 4 MG disintegrating tablet Commonly known as: Zofran ODT Take 1 tablet (4 mg total) by mouth every 8 (eight) hours as needed for nausea or vomiting.   oxyCODONE-acetaminophen 10-325 MG tablet Commonly known as: PERCOCET Take 0.5 tablets by mouth every 4 (four) hours as needed for pain.   tamsulosin 0.4 MG Caps capsule Commonly known as: Flomax Take 1 capsule (0.4 mg total) by mouth daily.       Allergies: No Known Allergies  Family History: Family  History  Problem Relation Age of Onset  . Diabetes Father   . Factor V Leiden deficiency Father   . Colon cancer Neg Hx     Social History:  reports that he quit smoking about 6 years ago. His smoking use included cigarettes. He has a 12.50 pack-year smoking history. He has never used smokeless tobacco. He reports current alcohol use of about 1.0 standard drink of alcohol per week. He reports that he does not use drugs.   Physical Exam: BP (!) 157/97   Pulse 73   Ht 5\' 9"  (1.753 m)   Wt 193 lb (87.5 kg)   BMI 28.50 kg/m   Constitutional:  Alert and oriented, mild distress, improved after Toradol injection HEENT: [place] AT, moist mucus membranes.   Trachea midline, no masses. Cardiovascular: No clubbing, cyanosis, or edema. Respiratory: Normal respiratory effort, no increased work of breathing. GI: Abdomen is soft, nontender, nondistended, no abdominal masses GU: + CVA tenderness Skin: No rashes, bruises or suspicious lesions. Neurologic: Grossly intact, no focal deficits, moving all 4 extremities. Psychiatric: Normal mood and affect.  Laboratory Data:  Lab Results  Component Value Date   CREATININE 1.19 09/12/2019     Pertinent Imaging:  Results for orders placed during the hospital encounter of 09/13/19  CT Renal Stone Study  Narrative CLINICAL DATA:  Right-sided flank and groin pain, initial encounter  EXAM: CT ABDOMEN AND PELVIS WITHOUT CONTRAST  TECHNIQUE: Multidetector CT imaging of the abdomen and pelvis was performed following the standard protocol without IV contrast.  COMPARISON:  09/04/2014  FINDINGS: Lower chest: No acute abnormality.  Hepatobiliary: No focal liver abnormality is seen. No gallstones, gallbladder wall thickening, or biliary dilatation.  Pancreas: Unremarkable. No pancreatic ductal dilatation or surrounding inflammatory changes.  Spleen: Normal in size without focal abnormality.  Adrenals/Urinary Tract: Adrenal glands are within normal limits. Kidneys are well visualized bilaterally. Left kidney demonstrates a tiny nonobstructing stone. The right kidney demonstrates mild hydronephrosis. There is a 6-7 mm stone identified in the proximal right ureter causing the obstructive change. The more distal right ureter is within normal limits. The bladder is partially distended.  Stomach/Bowel: The appendix is within normal limits. Diverticulosis of the colon is noted without evidence of diverticulitis. No small bowel or gastric abnormality is noted.  Vascular/Lymphatic: Aortic atherosclerosis. No enlarged abdominal or pelvic lymph nodes.  Reproductive: Prostate is  unremarkable.  Other: No abdominal wall hernia or abnormality. No abdominopelvic ascites.  Musculoskeletal: Degenerative changes of lumbar spine are noted.  IMPRESSION: Proximal right ureteral stone as described with obstructive change.  Tiny nonobstructing left renal stone.  Diverticulosis without diverticulitis.   Electronically Signed By: Inez Catalina M.D. On: 09/13/2019 02:11   I have personally reviewed the images and agree with radiologist interpretation.    Assessment & Plan:    1. Ureteral Stone / flank pain  Patient given a dose of toradol 30 mg injection in office today for severe acute pain, improved  Renal CT showed proximal right ureteral stone 6-7 mm as described with obstructive change. Tiny nonobstructing left renal stone. Diverticulosis without diverticulitis.  Based on size and location of the stone, patient has low% of spontaneous interval stone passage over 30-day period.  Given that the stone seems to be moving in location, patient may have a higher chance than the aforementioned.  Medical expulsive therapy was discussed as an option.  We discussed general stone prevention techniques including drinking plenty water with goal of producing 2.5 L urine daily, increased  citric acid intake, avoidance of high oxalate containing foods, and decreased salt intake.  Information about dietary recommendations given today.   We discussed various treatment options including ESWL vs. ureteroscopy, laser lithotripsy, and stent. We discussed the risks and benefits of both including bleeding, infection, damage to surrounding structures, efficacy with need for possible further intervention, and need for temporary ureteral stent.  Patient elected shock wave. Patient was made aware of the risk and benefits.   I discussed warning signs any symptoms that require immediate medical attention.  - UA/Urine culture sent. -KUB today.   [facility] [address], [place],  [address] [phone]  I, Selena Batten, am acting as a scribe for Dr. Hollice Espy.  I have reviewed the above documentation for accuracy and completeness, and I agree with the above.   Hollice Espy, MD

## 2019-09-15 ENCOUNTER — Other Ambulatory Visit: Payer: Self-pay

## 2019-09-15 ENCOUNTER — Other Ambulatory Visit
Admission: RE | Admit: 2019-09-15 | Discharge: 2019-09-15 | Disposition: A | Discharge status: Home / Self Care | Payer: BLUE CROSS/BLUE SHIELD | Attending: Urology | Admitting: Urology

## 2019-09-15 ENCOUNTER — Encounter: Payer: Self-pay | Admitting: Urology

## 2019-09-15 ENCOUNTER — Ambulatory Visit: Payer: BLUE CROSS/BLUE SHIELD | Admitting: Urology

## 2019-09-15 VITALS — BP 157/97 | HR 73 | Ht 69.0 in | Wt 193.0 lb

## 2019-09-15 DIAGNOSIS — R109 Unspecified abdominal pain: Secondary | ICD-10-CM | POA: Diagnosis not present

## 2019-09-15 MED ORDER — KETOROLAC TROMETHAMINE 60 MG/2ML IM SOLN
30.0000 mg | Freq: Once | INTRAMUSCULAR | Status: AC
  Administered 2019-09-15: 30 mg via INTRAMUSCULAR

## 2019-09-15 NOTE — Progress Notes (Signed)
IM Injection  Patient is present today for an IM Injection for treatment of Flank pain Drug: Toradol Dose:30mg  Location:Left arm Lot: [id number] Exp:12/07/2019 Patient tolerated well, no complications were noted  Preformed by: Judson Roch Sherard Sutch, CMA

## 2019-11-23 ENCOUNTER — Encounter: Payer: Self-pay | Admitting: Family Medicine

## 2019-11-23 DIAGNOSIS — F411 Generalized anxiety disorder: Secondary | ICD-10-CM

## 2019-11-23 MED ORDER — ALPRAZOLAM 0.5 MG PO TABS: 0.5000 mg | ORAL_TABLET | Freq: Three times a day (TID) | ORAL | 0 refills | Status: DC | PRN

## 2019-11-23 NOTE — Telephone Encounter (Signed)
Ok to refill??  Last office visit 10/27/2018.  Last refill 05/19/2019.

## 2019-12-29 ENCOUNTER — Telehealth: Payer: Self-pay | Admitting: Family Medicine

## 2019-12-29 ENCOUNTER — Other Ambulatory Visit: Payer: Self-pay | Admitting: Family Medicine

## 2019-12-29 DIAGNOSIS — F411 Generalized anxiety disorder: Secondary | ICD-10-CM

## 2019-12-29 MED ORDER — ESCITALOPRAM OXALATE 10 MG PO TABS: 10.0000 mg | ORAL_TABLET | Freq: Every day | ORAL | 3 refills | Status: DC

## 2019-12-29 NOTE — Telephone Encounter (Signed)
Pt need his Lexapro sent the pharmacy pt has up coming appt. on 01/02/2020

## 2020-01-02 ENCOUNTER — Ambulatory Visit (INDEPENDENT_AMBULATORY_CARE_PROVIDER_SITE_OTHER): Payer: BLUE CROSS/BLUE SHIELD | Admitting: Family Medicine

## 2020-01-02 ENCOUNTER — Other Ambulatory Visit: Payer: Self-pay

## 2020-01-02 DIAGNOSIS — F411 Generalized anxiety disorder: Secondary | ICD-10-CM

## 2020-01-02 MED ORDER — ALPRAZOLAM 0.5 MG PO TABS: 0.5000 mg | ORAL_TABLET | Freq: Three times a day (TID) | ORAL | 0 refills | Status: DC | PRN

## 2020-01-02 MED ORDER — ESCITALOPRAM OXALATE 10 MG PO TABS: 10.0000 mg | ORAL_TABLET | Freq: Every day | ORAL | 3 refills | Status: DC

## 2020-01-02 NOTE — Progress Notes (Signed)
Subjective:    Patient ID: Casey Stephens, male    DOB: [birth year], 59 y.o.   MRN: [id number]  Patient is a very pleasant 59 year old Caucasian male here today to get a refill on his medication.  His life is in turmoil.  His wife of 65 years is separated from him and is planning to divorce him.  He lost his job at roughly the same time and as a result lost his health insurance.  Therefore he is having to pay cash for today's office visit simply to refill his Lexapro.  He had lab work at the emergency room in July including a normal CMP and a normal CBC.  He is overdue for a colonoscopy but certainly at the present time he is unable to afford the cash price for a colonoscopy.  He is also been 2 years since he had a PSA for prostate cancer but at that time his PSA was unremarkable and he denies any lower urinary tract symptoms.  Despite all of his stress, the Lexapro seems to be working well.  Obviously he is dealing with grief and loss over the situation but he denies any anhedonia or suicidal thoughts or uncontrollable anxiety Past Medical History:  Diagnosis Date  . Allergy    Past Surgical History:  Procedure Laterality Date  . av fistula lt temple     ruptured artery from softball injury  . crushed thumb repair    . testicular biopsy     negative   Current Outpatient Medications on File Prior to Visit  Medication Sig Dispense Refill  . aspirin 81 MG tablet Take 81 mg by mouth daily.      . cetirizine (ZYRTEC) 10 MG tablet Take 10 mg by mouth as needed for allergies.    . fish oil-omega-3 fatty acids 1000 MG capsule Take 2 g by mouth daily.      . ondansetron (ZOFRAN ODT) 4 MG disintegrating tablet Take 1 tablet (4 mg total) by mouth every 8 (eight) hours as needed for nausea or vomiting. (Patient not taking: Reported on 01/02/2020) 20 tablet 0  . oxyCODONE-acetaminophen (PERCOCET) 10-325 MG tablet Take 0.5 tablets by mouth every 4 (four) hours as needed for pain. (Patient not taking:  Reported on 01/02/2020) 30 tablet 0  . tamsulosin (FLOMAX) 0.4 MG CAPS capsule Take 1 capsule (0.4 mg total) by mouth daily. (Patient not taking: Reported on 01/02/2020) 14 capsule 0   No current facility-administered medications on file prior to visit.   No Known Allergies Social History   Socioeconomic History  . Marital status: Divorced    Spouse name: Not on file  . Number of children: Not on file  . Years of education: Not on file  . Highest education level: Not on file  Occupational History  . Not on file  Tobacco Use  . Smoking status: Former Smoker    Packs/day: 0.50    Years: 25.00    Pack years: 12.50    Types: Cigarettes    Quit date: 01/29/2013    Years since quitting: 6.9  . Smokeless tobacco: Never Used  Substance and Sexual Activity  . Alcohol use: Yes    Alcohol/week: 1.0 standard drink    Types: 1 Cans of beer per week  . Drug use: No  . Sexual activity: Not on file  Other Topics Concern  . Not on file  Social History Narrative  . Not on file   Social Determinants of Health   Financial Resource  Strain:   . Difficulty of Paying Living Expenses: Not on file  Food Insecurity:   . Worried About Charity fundraiser in the Last Year: Not on file  . Ran Out of Food in the Last Year: Not on file  Transportation Needs:   . Lack of Transportation (Medical): Not on file  . Lack of Transportation (Non-Medical): Not on file  Physical Activity:   . Days of Exercise per Week: Not on file  . Minutes of Exercise per Session: Not on file  Stress:   . Feeling of Stress : Not on file  Social Connections:   . Frequency of Communication with Friends and Family: Not on file  . Frequency of Social Gatherings with Friends and Family: Not on file  . Attends Religious Services: Not on file  . Active Member of Clubs or Organizations: Not on file  . Attends Archivist Meetings: Not on file  . Marital Status: Not on file  Intimate Partner Violence:   . Fear of  Current or Ex-Partner: Not on file  . Emotionally Abused: Not on file  . Physically Abused: Not on file  . Sexually Abused: Not on file      Review of Systems  All other systems reviewed and are negative.      Objective:   Physical Exam Vitals reviewed.  Constitutional:      General: He is not in acute distress.    Appearance: He is well-developed. He is not diaphoretic.  Cardiovascular:     Rate and Rhythm: Normal rate and regular rhythm.     Heart sounds: Normal heart sounds. No murmur heard.  No friction rub. No gallop.   Pulmonary:     Effort: Pulmonary effort is normal. No respiratory distress.     Breath sounds: Normal breath sounds. No wheezing or rales.  Chest:     Chest wall: No tenderness.  Genitourinary:    Prostate: Not enlarged and not tender.  Skin:    General: Skin is warm.     Coloration: Skin is not pale.     Findings: No erythema or rash.  Psychiatric:        Behavior: Behavior normal.        Thought Content: Thought content normal.        Judgment: Judgment normal.           Assessment & Plan:  GAD (generalized anxiety disorder) - Plan: escitalopram (LEXAPRO) 10 MG tablet, ALPRAZolam (XANAX) 0.5 MG tablet, DISCONTINUED: ALPRAZolam (XANAX) 0.5 MG tablet  I will not charge the patient for today's office visit.  I will gladly refill his Lexapro because certainly he is benefiting from it at the present time.  He recently had baseline labs in July and therefore I see no reason to repeat them at the present time.  He is due for a colonoscopy as well as a PSA however this can certainly wait until he gets his insurance situated and finds a new job.  The patient is comfortable with this plan and prefers to do that.

## 2020-01-04 ENCOUNTER — Other Ambulatory Visit: Payer: Self-pay

## 2020-01-04 DIAGNOSIS — F411 Generalized anxiety disorder: Secondary | ICD-10-CM

## 2020-01-04 MED ORDER — ESCITALOPRAM OXALATE 10 MG PO TABS: 10.0000 mg | ORAL_TABLET | Freq: Every day | ORAL | 3 refills | Status: DC

## 2020-01-05 ENCOUNTER — Other Ambulatory Visit: Payer: Self-pay

## 2020-08-06 ENCOUNTER — Other Ambulatory Visit: Payer: Self-pay

## 2020-08-06 ENCOUNTER — Ambulatory Visit (INDEPENDENT_AMBULATORY_CARE_PROVIDER_SITE_OTHER): Payer: 59 | Admitting: Family Medicine

## 2020-08-06 ENCOUNTER — Encounter: Payer: Self-pay | Admitting: Family Medicine

## 2020-08-06 VITALS — BP 124/62 | HR 76 | Temp 98.3°F | Resp 14 | Ht 69.0 in | Wt 189.0 lb

## 2020-08-06 DIAGNOSIS — R1319 Other dysphagia: Secondary | ICD-10-CM | POA: Diagnosis not present

## 2020-08-06 MED ORDER — PANTOPRAZOLE SODIUM 40 MG PO TBEC: 40.0000 mg | DELAYED_RELEASE_TABLET | Freq: Every day | ORAL | 3 refills | Status: DC

## 2020-08-06 NOTE — Progress Notes (Signed)
Subjective:    Patient ID: Casey Stephens, male    DOB: 1960-03-21, 60 y.o.   MRN: [id number] Patient previously saw Dr. Hilarie Fredrickson in 2017 for a colonoscopy.  Over the last few months he has been developing dysphagia.  He states that when he eats meat, sometimes a will get stuck in his distal esophagus.  Usually he would be able to drink something and get it to go on down.  However this weekend, he was eating lunch and his food became stuck in his distal esophagus.  He was unable to get the food to pass.  He went to the bathroom and had to force himself to throw up in order to clear his esophagus.  Afterward he developed hiccups.  For several hours, whenever he would eat or drink anything, he was started to hiccup and vomit.  He went to an urgent care where they gave him a shot of Phenergan and prescribed dicyclomine.  He never got the dicyclomine.  He made the appointment for today.  He states this morning he was able to drink water and then Gatorade has been able to eat some crackers without any issues.  He has been hesitant to try anything more substantial.  He denies any nausea or chest pain or abdominal pain.  He denies any fevers or chills or melena or hematochezia  Past Medical History:  Diagnosis Date  . Allergy    Past Surgical History:  Procedure Laterality Date  . av fistula lt temple     ruptured artery from softball injury  . crushed thumb repair    . testicular biopsy     negative   Current Outpatient Medications on File Prior to Visit  Medication Sig Dispense Refill  . ALPRAZolam (XANAX) 0.5 MG tablet Take 1 tablet (0.5 mg total) by mouth 3 (three) times daily as needed. for anxiety 30 tablet 0  . aspirin 81 MG tablet Take 81 mg by mouth daily.      . cetirizine (ZYRTEC) 10 MG tablet Take 10 mg by mouth as needed for allergies.    Marland Kitchen escitalopram (LEXAPRO) 10 MG tablet Take 1 tablet (10 mg total) by mouth daily. 90 tablet 3  . fish oil-omega-3 fatty acids 1000 MG capsule Take 2  g by mouth daily.      . ondansetron (ZOFRAN ODT) 4 MG disintegrating tablet Take 1 tablet (4 mg total) by mouth every 8 (eight) hours as needed for nausea or vomiting. (Patient not taking: Reported on 01/02/2020) 20 tablet 0  . oxyCODONE-acetaminophen (PERCOCET) 10-325 MG tablet Take 0.5 tablets by mouth every 4 (four) hours as needed for pain. (Patient not taking: Reported on 01/02/2020) 30 tablet 0  . tamsulosin (FLOMAX) 0.4 MG CAPS capsule Take 1 capsule (0.4 mg total) by mouth daily. (Patient not taking: Reported on 01/02/2020) 14 capsule 0   No current facility-administered medications on file prior to visit.   No Known Allergies Social History   Socioeconomic History  . Marital status: Divorced    Spouse name: Not on file  . Number of children: Not on file  . Years of education: Not on file  . Highest education level: Not on file  Occupational History  . Not on file  Tobacco Use  . Smoking status: Former Smoker    Packs/day: 0.50    Years: 25.00    Pack years: 12.50    Types: Cigarettes    Quit date: 01/29/2013    Years since quitting: 7.5  .  Smokeless tobacco: Never Used  Substance and Sexual Activity  . Alcohol use: Yes    Alcohol/week: 1.0 standard drink    Types: 1 Cans of beer per week  . Drug use: No  . Sexual activity: Not on file  Other Topics Concern  . Not on file  Social History Narrative  . Not on file   Social Determinants of Health   Financial Resource Strain: Not on file  Food Insecurity: Not on file  Transportation Needs: Not on file  Physical Activity: Not on file  Stress: Not on file  Social Connections: Not on file  Intimate Partner Violence: Not on file      Review of Systems  All other systems reviewed and are negative.      Objective:   Physical Exam Vitals reviewed.  Constitutional:      General: He is not in acute distress.    Appearance: He is well-developed. He is not diaphoretic.  Cardiovascular:     Rate and Rhythm:  Normal rate and regular rhythm.     Heart sounds: Normal heart sounds. No murmur heard. No friction rub. No gallop.   Pulmonary:     Effort: Pulmonary effort is normal. No respiratory distress.     Breath sounds: Normal breath sounds. No wheezing or rales.  Chest:     Chest wall: No tenderness.  Skin:    General: Skin is warm.     Coloration: Skin is not pale.     Findings: No erythema or rash.  Psychiatric:        Behavior: Behavior normal.        Thought Content: Thought content normal.        Judgment: Judgment normal.           Assessment & Plan:  Esophageal dysphagia  I suspect the patient has an esophageal stricture.  Recommend GI consultation for EGD to further diagnose and possibly treat an esophageal stricture.  Meanwhile begin Protonix 40 mg a day.

## 2020-12-01 ENCOUNTER — Other Ambulatory Visit: Payer: Self-pay | Admitting: Family Medicine

## 2021-01-13 ENCOUNTER — Other Ambulatory Visit: Payer: Self-pay | Admitting: *Deleted

## 2021-01-13 DIAGNOSIS — F411 Generalized anxiety disorder: Secondary | ICD-10-CM

## 2021-01-13 MED ORDER — ESCITALOPRAM OXALATE 10 MG PO TABS: 10.0000 mg | ORAL_TABLET | Freq: Every day | ORAL | 0 refills | Status: DC

## 2021-01-13 NOTE — Telephone Encounter (Signed)
Received fax requesting refill on xanax.   Ok to refill??  Last office visit 08/06/2020.  Last refill 01/02/2020.

## 2021-01-14 MED ORDER — ALPRAZOLAM 0.5 MG PO TABS: 0.5000 mg | ORAL_TABLET | Freq: Three times a day (TID) | ORAL | 0 refills | Status: DC | PRN

## 2021-04-17 ENCOUNTER — Other Ambulatory Visit: Payer: Self-pay | Admitting: Family Medicine

## 2021-04-18 ENCOUNTER — Other Ambulatory Visit: Payer: Self-pay

## 2021-04-18 DIAGNOSIS — F411 Generalized anxiety disorder: Secondary | ICD-10-CM

## 2021-04-18 MED ORDER — ESCITALOPRAM OXALATE 10 MG PO TABS: 10.0000 mg | ORAL_TABLET | Freq: Every day | ORAL | 3 refills | Status: DC

## 2021-06-19 ENCOUNTER — Ambulatory Visit (INDEPENDENT_AMBULATORY_CARE_PROVIDER_SITE_OTHER): Payer: 59 | Admitting: Family Medicine

## 2021-06-19 ENCOUNTER — Encounter: Payer: Self-pay | Admitting: Family Medicine

## 2021-06-19 VITALS — BP 140/102 | HR 64 | Temp 97.1°F | Ht 69.0 in | Wt 199.4 lb

## 2021-06-19 DIAGNOSIS — Z1211 Encounter for screening for malignant neoplasm of colon: Secondary | ICD-10-CM

## 2021-06-19 DIAGNOSIS — Z0000 Encounter for general adult medical examination without abnormal findings: Secondary | ICD-10-CM

## 2021-06-19 DIAGNOSIS — Z13220 Encounter for screening for lipoid disorders: Secondary | ICD-10-CM

## 2021-06-19 DIAGNOSIS — Z125 Encounter for screening for malignant neoplasm of prostate: Secondary | ICD-10-CM

## 2021-06-19 NOTE — Progress Notes (Signed)
? ?Subjective:  ? ? Patient ID: Casey Stephens, male    DOB: 28-Jul-1960, 61 y.o.   MRN: [id number] ?Patient is a very pleasant 61 year old Caucasian gentleman who presents today for complete physical exam.  His last colonoscopy was 5 years ago.  His gastroenterologist recommended a repeat colonoscopy in 5 years due to colon polyps I will be happy to schedule that.  He is due for a PSA to screen for prostate cancer.  He also reports hypersensitivity in the testicular area.  On examination today there is some testicular atrophy.  Testicles are exquisitely tender to palpation but I do not appreciate any testicular mass or hydrocele.  The pain is chronic so I do not feel as epididymitis or any type of infection.  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  At the present time he is asymptomatic and declines an ultrasound.  His blood pressure today is elevated at 140/102.  He has not been checking his blood pressure at home but he denies any chest pain or shortness of breath or dyspnea on exertion.  He is due for COVID booster.  Otherwise his immunizations are up-to-date. ? ?Past Medical History:  ?Diagnosis Date  ? Allergy   ? ?Past Surgical History:  ?Procedure Laterality Date  ? av fistula lt temple    ? ruptured artery from softball injury  ? crushed thumb repair    ? testicular biopsy    ? negative  ? ?Current Outpatient Medications on File Prior to Visit  ?Medication Sig Dispense Refill  ? ALPRAZolam (XANAX) 0.5 MG tablet Take 1 tablet (0.5 mg total) by mouth 3 (three) times daily as needed. for anxiety 30 tablet 0  ? aspirin 81 MG tablet Take 81 mg by mouth daily.    ? cetirizine (ZYRTEC) 10 MG tablet Take 10 mg by mouth as needed for allergies.    ? escitalopram (LEXAPRO) 10 MG tablet Take 1 tablet (10 mg total) by mouth daily. 90 tablet 3  ? fish oil-omega-3 fatty acids 1000 MG capsule Take 2 g by mouth daily.    ? pantoprazole  (PROTONIX) 40 MG tablet TAKE ONE TABLET BY MOUTH DAILY 30 tablet 3  ? ?No current facility-administered medications on file prior to visit.  ? ?No Known Allergies ?Social History  ? ?Socioeconomic History  ? Marital status: Divorced  ?  Spouse name: Not on file  ? Number of children: Not on file  ? Years of education: Not on file  ? Highest education level: Not on file  ?Occupational History  ? Not on file  ?Tobacco Use  ? Smoking status: Former  ?  Packs/day: 0.50  ?  Years: 25.00  ?  Pack years: 12.50  ?  Types: Cigarettes  ?  Quit date: 01/29/2013  ?  Years since quitting: 8.3  ? Smokeless tobacco: Never  ?Substance and Sexual Activity  ? Alcohol use: Yes  ?  Alcohol/week: 1.0 standard drink  ?  Types: 1 Cans of beer per week  ? Drug use: No  ? Sexual activity: Not on file  ?Other Topics Concern  ? Not on file  ?Social History Narrative  ? Not on file  ? ?Social Determinants of Health  ? ?Financial Resource Strain: Not on file  ?Food Insecurity: Not on file  ?Transportation Needs: Not on file  ?Physical Activity: Not on file  ?Stress: Not on file  ?Social Connections: Not on file  ?Intimate  Partner Violence: Not on file  ? ? ? ? ?Review of Systems  ?All other systems reviewed and are negative. ? ?   ?Objective:  ? Physical Exam ?Vitals reviewed.  ?Constitutional:   ?   General: He is not in acute distress. ?   Appearance: He is well-developed. He is not diaphoretic.  ?HENT:  ?   Head: Normocephalic and atraumatic.  ?   Right Ear: External ear normal.  ?   Left Ear: External ear normal.  ?   Nose: Nose normal.  ?   Mouth/Throat:  ?   Pharynx: No oropharyngeal exudate.  ?Eyes:  ?   General: No scleral icterus.    ?   Right eye: No discharge.     ?   Left eye: No discharge.  ?   Conjunctiva/sclera: Conjunctivae normal.  ?   Pupils: Pupils are equal, round, and reactive to light.  ?Neck:  ?   Thyroid: No thyromegaly.  ?   Vascular: No JVD.  ?   Trachea: No tracheal deviation.  ?Cardiovascular:  ?   Rate and Rhythm:  Normal rate and regular rhythm.  ?   Heart sounds: Normal heart sounds. No murmur heard. ?  No friction rub. No gallop.  ?Pulmonary:  ?   Effort: Pulmonary effort is normal. No respiratory distress.  ?   Breath sounds: Normal breath sounds. No stridor. No wheezing or rales.  ?Chest:  ?   Chest wall: No tenderness.  ?Abdominal:  ?   General: Bowel sounds are normal. There is no distension.  ?   Palpations: Abdomen is soft. There is no mass.  ?   Tenderness: There is no abdominal tenderness. There is no guarding or rebound.  ?Genitourinary: ?   Penis: Normal.   ?   Testes:     ?   Right: Tenderness present. Testicular hydrocele or varicocele not present.     ?   Left: Tenderness present. Testicular hydrocele or varicocele not present.  ?   Prostate: Normal. Not enlarged and not tender.  ?   Rectum: Normal.  ?Musculoskeletal:     ?   General: No tenderness.  ?   Cervical back: Normal range of motion and neck supple.  ?Lymphadenopathy:  ?   Cervical: No cervical adenopathy.  ?Skin: ?   General: Skin is warm.  ?   Coloration: Skin is not pale.  ?   Findings: No erythema or rash.  ?Neurological:  ?   Mental Status: He is alert and oriented to person, place, and time.  ?   Cranial Nerves: No cranial nerve deficit.  ?   Motor: No abnormal muscle tone.  ?   Coordination: Coordination normal.  ?   Deep Tendon Reflexes: Reflexes normal.  ?Psychiatric:     ?   Behavior: Behavior normal.     ?   Thought Content: Thought content normal.     ?   Judgment: Judgment normal.  ? ? ? ? ? ?   ?Assessment & Plan:  ? ?General medical exam - Plan: CBC with Differential/Platelet, Lipid panel, COMPLETE METABOLIC PANEL WITH GFR, PSA ? ?Prostate cancer screening - Plan: PSA ? ?Screening cholesterol level - Plan: CBC with Differential/Platelet, Lipid panel, COMPLETE METABOLIC PANEL WITH GFR ? ?Colon cancer screening - Plan: Ambulatory referral to Gastroenterology ?I am concerned by his blood pressure.  Some of this may be anxiety related.   Therefore I asked the patient to check his blood pressure at home over  the next 2 weeks and report the values to me.  If consistently greater than 140/90, I would recommend an angiotensin receptor blocker such as valsartan.  I will schedule the patient for a colonoscopy.  I will screen for prostate cancer with PSA.  I will check a CBC CMP and a lipid panel.  Ideally I like his LDL cholesterol to be below 100.  I believe the testicular pain is due to chronic microvascular damage and atrophy with resultant nerve pain.  We discussed a scrotal ultrasound however the patient declines this at the present time.  I did recommend a COVID booster. ?

## 2021-06-20 ENCOUNTER — Encounter: Payer: Self-pay | Admitting: Internal Medicine

## 2021-06-20 LAB — COMPLETE METABOLIC PANEL WITH GFR
AG Ratio: 1.7 (calc) (ref 1.0–2.5)
ALT: 29 U/L (ref 9–46)
AST: 22 U/L (ref 10–35)
Albumin: 4.7 g/dL (ref 3.6–5.1)
Alkaline phosphatase (APISO): 90 U/L (ref 35–144)
BUN: 15 mg/dL (ref 7–25)
CO2: 25 mmol/L (ref 20–32)
Calcium: 9.9 mg/dL (ref 8.6–10.3)
Chloride: 103 mmol/L (ref 98–110)
Creat: 1.05 mg/dL (ref 0.70–1.35)
Globulin: 2.7 g/dL (calc) (ref 1.9–3.7)
Glucose, Bld: 91 mg/dL (ref 65–99)
Potassium: 4.2 mmol/L (ref 3.5–5.3)
Sodium: 139 mmol/L (ref 135–146)
Total Bilirubin: 0.8 mg/dL (ref 0.2–1.2)
Total Protein: 7.4 g/dL (ref 6.1–8.1)
eGFR: 81 mL/min/{1.73_m2} (ref >=60)

## 2021-06-20 LAB — LIPID PANEL
Cholesterol: 225 mg/dL — ABNORMAL HIGH (ref <200)
HDL: 64 mg/dL (ref >=40)
LDL Cholesterol (Calc): 123 mg/dL (calc) — ABNORMAL HIGH
Non-HDL Cholesterol (Calc): 161 mg/dL (calc) — ABNORMAL HIGH (ref <130)
Total CHOL/HDL Ratio: 3.5 (calc) (ref <5.0)
Triglycerides: 237 mg/dL — ABNORMAL HIGH (ref <150)

## 2021-06-20 LAB — CBC WITH DIFFERENTIAL/PLATELET
Absolute Monocytes: 886 cells/uL (ref 200–950)
Basophils Absolute: 77 cells/uL (ref 0–200)
Basophils Relative: 0.9 %
Eosinophils Absolute: 181 cells/uL (ref 15–500)
Eosinophils Relative: 2.1 %
HCT: 46.9 % (ref 38.5–50.0)
Hemoglobin: 15.8 g/dL (ref 13.2–17.1)
Lymphs Abs: 2984 cells/uL (ref 850–3900)
MCH: 30.6 pg (ref 27.0–33.0)
MCHC: 33.7 g/dL (ref 32.0–36.0)
MCV: 90.9 fL (ref 80.0–100.0)
MPV: 10.6 fL (ref 7.5–12.5)
Monocytes Relative: 10.3 %
Neutro Abs: 4472 cells/uL (ref 1500–7800)
Neutrophils Relative %: 52 %
Platelets: 229 10*3/uL (ref 140–400)
RBC: 5.16 10*6/uL (ref 4.20–5.80)
RDW: 12.5 % (ref 11.0–15.0)
Total Lymphocyte: 34.7 %
WBC: 8.6 10*3/uL (ref 3.8–10.8)

## 2021-06-20 LAB — PSA: PSA: 0.2 ng/mL (ref <=4.00)

## 2021-06-23 ENCOUNTER — Other Ambulatory Visit: Payer: Self-pay

## 2021-06-23 DIAGNOSIS — R030 Elevated blood-pressure reading, without diagnosis of hypertension: Secondary | ICD-10-CM

## 2021-06-23 DIAGNOSIS — E7800 Pure hypercholesterolemia, unspecified: Secondary | ICD-10-CM

## 2021-06-23 MED ORDER — ROSUVASTATIN CALCIUM 10 MG PO TABS: 10.0000 mg | ORAL_TABLET | Freq: Every day | ORAL | 3 refills | Status: DC

## 2021-06-27 ENCOUNTER — Other Ambulatory Visit: Payer: Self-pay | Admitting: Family Medicine

## 2021-06-27 ENCOUNTER — Encounter: Payer: Self-pay | Admitting: Family Medicine

## 2021-06-27 MED ORDER — VALSARTAN 80 MG PO TABS: 80.0000 mg | ORAL_TABLET | Freq: Every day | ORAL | 3 refills | Status: DC

## 2021-07-01 ENCOUNTER — Ambulatory Visit (AMBULATORY_SURGERY_CENTER): Payer: 59 | Admitting: *Deleted

## 2021-07-01 VITALS — Ht 69.0 in | Wt 200.0 lb

## 2021-07-01 DIAGNOSIS — Z86010 Personal history of colonic polyps: Secondary | ICD-10-CM

## 2021-07-01 MED ORDER — PEG 3350-KCL-NA BICARB-NACL 420 G PO SOLR: 4000.0000 mL | Freq: Once | ORAL | 0 refills | Status: AC

## 2021-07-01 NOTE — Progress Notes (Signed)

## 2021-07-14 ENCOUNTER — Encounter: Payer: Self-pay | Admitting: Family Medicine

## 2021-07-18 ENCOUNTER — Encounter: Payer: Self-pay | Admitting: Internal Medicine

## 2021-07-24 ENCOUNTER — Encounter: Payer: Self-pay | Admitting: Internal Medicine

## 2021-07-24 ENCOUNTER — Ambulatory Visit (AMBULATORY_SURGERY_CENTER): Payer: 59 | Admitting: Internal Medicine

## 2021-07-24 VITALS — BP 118/82 | HR 57 | Temp 96.9°F | Resp 18 | Ht 69.0 in | Wt 200.0 lb

## 2021-07-24 DIAGNOSIS — D123 Benign neoplasm of transverse colon: Secondary | ICD-10-CM

## 2021-07-24 DIAGNOSIS — Z86010 Personal history of colonic polyps: Secondary | ICD-10-CM | POA: Diagnosis not present

## 2021-07-24 DIAGNOSIS — D122 Benign neoplasm of ascending colon: Secondary | ICD-10-CM | POA: Diagnosis not present

## 2021-07-24 MED ORDER — SODIUM CHLORIDE 0.9 % IV SOLN: 500.0000 mL | Freq: Once | INTRAVENOUS | Status: DC

## 2021-07-24 NOTE — Patient Instructions (Signed)
HANDOUTS given for polyps and diverticulosis.  YOU HAD AN ENDOSCOPIC PROCEDURE TODAY AT [facility]:   Refer to the procedure report that was given to you for any specific questions about what was found during the examination.  If the procedure report does not answer your questions, please call your gastroenterologist to clarify.  If you requested that your care partner not be given the details of your procedure findings, then the procedure report has been included in a sealed envelope for you to review at your convenience later.  YOU SHOULD EXPECT: Some feelings of bloating in the abdomen. Passage of more gas than usual.  Walking can help get rid of the air that was put into your GI tract during the procedure and reduce the bloating. If you had a lower endoscopy (such as a colonoscopy or flexible sigmoidoscopy) you may notice spotting of blood in your stool or on the toilet paper. If you underwent a bowel prep for your procedure, you may not have a normal bowel movement for a few days.  Please Note:  You might notice some irritation and congestion in your nose or some drainage.  This is from the oxygen used during your procedure.  There is no need for concern and it should clear up in a day or so.  SYMPTOMS TO REPORT IMMEDIATELY:  Following lower endoscopy (colonoscopy or flexible sigmoidoscopy):  Excessive amounts of blood in the stool  Significant tenderness or worsening of abdominal pains  Swelling of the abdomen that is new, acute  Fever of 100F or higher  For urgent or emergent issues, a gastroenterologist can be reached at any hour by calling [phone]. Do not use MyChart messaging for urgent concerns.    DIET:  We do recommend a small meal at first, but then you may proceed to your regular diet.  Drink plenty of fluids but you should avoid alcoholic beverages for 24 hours.  ACTIVITY:  You should plan to take it easy for the rest of today and you should NOT DRIVE  or use heavy machinery until tomorrow (because of the sedation medicines used during the test).    FOLLOW UP: Our staff will call the number listed on your records 48-72 hours following your procedure to check on you and address any questions or concerns that you may have regarding the information given to you following your procedure. If we do not reach you, we will leave a message.  We will attempt to reach you two times.  During this call, we will ask if you have developed any symptoms of COVID 19. If you develop any symptoms (ie: fever, flu-like symptoms, shortness of breath, cough etc.) before then, please call [phone].  If you test positive for Covid 19 in the 2 weeks post procedure, please call and report this information to [place].    If any biopsies were taken you will be contacted by phone or by letter within the next 1-3 weeks.  Please call us at [phone] if you have not heard about the biopsies in 3 weeks.    SIGNATURES/CONFIDENTIALITY: You and/or your care partner have signed paperwork which will be entered into your electronic medical record.  These signatures attest to the fact that that the information above on your After Visit Summary has been reviewed and is understood.  Full responsibility of the confidentiality of this discharge information lies with you and/or your care-partner.

## 2021-07-24 NOTE — Progress Notes (Signed)
Called to room to assist during endoscopic procedure.  Patient ID and intended procedure confirmed with present staff. Received instructions for my participation in the procedure from the performing physician.  

## 2021-07-24 NOTE — Progress Notes (Signed)
Pt's states no medical or surgical changes since previsit or office visit. 

## 2021-07-24 NOTE — Progress Notes (Signed)
GASTROENTEROLOGY PROCEDURE H&P NOTE   Primary Care Physician: Susy Frizzle, MD    Reason for Procedure:  History of colon polyps  Plan:    Surveillance colonoscopy  Patient is appropriate for endoscopic procedure(s) in the ambulatory ([place]) setting.  The nature of the procedure, as well as the risks, benefits, and alternatives were carefully and thoroughly reviewed with the patient. Ample time for discussion and questions allowed. The patient understood, was satisfied, and agreed to proceed.     HPI: Casey Stephens is a 61 y.o. male who presents for surveillance colonoscopy.  Medical history as below.  Tolerated the prep.  No recent chest pain or shortness of breath.  No abdominal pain today.  Past Medical History:  Diagnosis Date   Allergy    SEASONAL   Arthritis    BACK,NECK,HANDS   GERD (gastroesophageal reflux disease)    Hyperlipidemia    Hypertension     Past Surgical History:  Procedure Laterality Date   av fistula lt temple     ruptured artery from softball injury   COLONOSCOPY     crushed thumb repair     POLYPECTOMY     testicular biopsy     negative    Prior to Admission medications   Medication Sig Start Date End Date Taking? Authorizing Provider  aspirin 81 MG tablet Take 81 mg by mouth daily.   Yes [provider]  cetirizine (ZYRTEC) 10 MG tablet Take 10 mg by mouth as needed for allergies.   Yes [provider]  escitalopram (LEXAPRO) 10 MG tablet Take 1 tablet (10 mg total) by mouth daily. 04/18/21  Yes Susy Frizzle, MD  fish oil-omega-3 fatty acids 1000 MG capsule Take 2 g by mouth daily.   Yes [provider]  pantoprazole (PROTONIX) 40 MG tablet TAKE ONE TABLET BY MOUTH DAILY 04/18/21  Yes Susy Frizzle, MD  rosuvastatin (CRESTOR) 10 MG tablet Take 1 tablet (10 mg total) by mouth daily. 06/23/21  Yes Susy Frizzle, MD  valsartan (DIOVAN) 80 MG tablet Take 1 tablet (80 mg total) by mouth daily.  06/27/21  Yes Susy Frizzle, MD  ALPRAZolam Duanne Moron) 0.5 MG tablet Take 1 tablet (0.5 mg total) by mouth 3 (three) times daily as needed. for anxiety 01/14/21   Susy Frizzle, MD    Current Outpatient Medications  Medication Sig Dispense Refill   aspirin 81 MG tablet Take 81 mg by mouth daily.     cetirizine (ZYRTEC) 10 MG tablet Take 10 mg by mouth as needed for allergies.     escitalopram (LEXAPRO) 10 MG tablet Take 1 tablet (10 mg total) by mouth daily. 90 tablet 3   fish oil-omega-3 fatty acids 1000 MG capsule Take 2 g by mouth daily.     pantoprazole (PROTONIX) 40 MG tablet TAKE ONE TABLET BY MOUTH DAILY 30 tablet 3   rosuvastatin (CRESTOR) 10 MG tablet Take 1 tablet (10 mg total) by mouth daily. 90 tablet 3   valsartan (DIOVAN) 80 MG tablet Take 1 tablet (80 mg total) by mouth daily. 90 tablet 3   ALPRAZolam (XANAX) 0.5 MG tablet Take 1 tablet (0.5 mg total) by mouth 3 (three) times daily as needed. for anxiety 30 tablet 0   Current Facility-Administered Medications  Medication Dose Route Frequency Provider Last Rate Last Admin   0.9 %  sodium chloride infusion  500 mL Intravenous Once Delynn Olvera, Lajuan Lines, MD        Allergies  as of 07/24/2021   (No Known Allergies)    Family History  Problem Relation Age of Onset   Colon polyps Mother    Diabetes Father    Factor V Leiden deficiency Father    Colon cancer Neg Hx    Crohn's disease Neg Hx    Esophageal cancer Neg Hx    Rectal cancer Neg Hx    Stomach cancer Neg Hx     Social History   Socioeconomic History   Marital status: Divorced    Spouse name: Not on file   Number of children: Not on file   Years of education: Not on file   Highest education level: Not on file  Occupational History   Not on file  Tobacco Use   Smoking status: Former    Packs/day: 0.50    Years: 25.00    Pack years: 12.50    Types: Cigarettes    Quit date: 01/29/2013    Years since quitting: 8.4   Smokeless tobacco: Never  Vaping Use    Vaping Use: Some days   Substances: CBD  Substance and Sexual Activity   Alcohol use: Yes    Alcohol/week: 1.0 standard drink    Types: 1 Cans of beer per week    Comment: 1-2 BEER DAILY   Drug use: No   Sexual activity: Not on file  Other Topics Concern   Not on file  Social History Narrative   Not on file   Social Determinants of Health   Financial Resource Strain: Not on file  Food Insecurity: Not on file  Transportation Needs: Not on file  Physical Activity: Not on file  Stress: Not on file  Social Connections: Not on file  Intimate Partner Violence: Not on file    Physical Exam: Vital signs in last 24 hours: '@BP'$  124/75   Pulse 70   Temp (!) 96.9 F (36.1 C)   Ht '5\' 9"'$  (1.753 m)   Wt 200 lb (90.7 kg)   SpO2 97%   BMI 29.53 kg/m  GEN: NAD EYE: Sclerae anicteric ENT: MMM CV: Non-tachycardic Pulm: CTA b/l GI: Soft, NT/ND NEURO:  Alert & Oriented x 3   Zenovia Jarred, MD [facility]  07/24/2021 8:30 AM

## 2021-07-24 NOTE — Op Note (Signed)
[place] Patient Name: Casey Stephens Procedure Date: 07/24/2021 8:27 AM MRN: [id number] Endoscopist: Jerene Bears , MD Age: 61 Referring MD:  Date of Birth: 1961/02/28 Gender: Male Account #: [id number] Procedure:                Colonoscopy Indications:              High risk colon cancer surveillance: Personal                            history of sessile serrated colon polyp (less than                            10 mm in size) with no dysplasia and small                            adenomas, Last colonoscopy: June 2017 Medicines:                Monitored Anesthesia Care Procedure:                Pre-Anesthesia Assessment:                           - Prior to the procedure, a History and Physical                            was performed, and patient medications and                            allergies were reviewed. The patient's tolerance of                            previous anesthesia was also reviewed. The risks                            and benefits of the procedure and the sedation                            options and risks were discussed with the patient.                            All questions were answered, and informed consent                            was obtained. Prior Anticoagulants: The patient has                            taken no previous anticoagulant or antiplatelet                            agents. ASA Grade Assessment: II - A patient with                            mild systemic disease. After reviewing the risks  and benefits, the patient was deemed in                            satisfactory condition to undergo the procedure.                           After obtaining informed consent, the colonoscope                            was passed under direct vision. Throughout the                            procedure, the patient's blood pressure, pulse, and                            oxygen saturations were monitored  continuously. The                            Olympus CF-HQ190L [id number]) Colonoscope was                            introduced through the anus and advanced to the                            cecum, identified by appendiceal orifice and                            ileocecal valve. The colonoscopy was performed                            without difficulty. The patient tolerated the                            procedure well. The quality of the bowel                            preparation was good. The ileocecal valve,                            appendiceal orifice, and rectum were photographed. Scope In: 8:40:58 AM Scope Out: 8:56:20 AM Scope Withdrawal Time: 0 hours 13 minutes 24 seconds  Total Procedure Duration: 0 hours 15 minutes 22 seconds  Findings:                 The digital rectal exam was normal.                           A 2 mm polyp was found in the ascending colon. The                            polyp was sessile. The polyp was removed with a                            cold biopsy forceps. Resection and retrieval were  complete.                           A 5 mm polyp was found in the transverse colon. The                            polyp was sessile. The polyp was removed with a                            cold snare. Resection and retrieval were complete.                           Multiple small and large-mouthed diverticula were                            found from ascending colon to sigmoid colon.                           Internal hemorrhoids were found during                            retroflexion. The hemorrhoids were small. Complications:            No immediate complications. Estimated Blood Loss:     Estimated blood loss: none. Impression:               - One 2 mm polyp in the ascending colon, removed                            with a cold biopsy forceps. Resected and retrieved.                           - One 5 mm polyp in the transverse  colon, removed                            with a cold snare. Resected and retrieved.                           - Diverticulosis from ascending colon to sigmoid                            colon.                           - Small internal hemorrhoids. Recommendation:           - Patient has a contact number available for                            emergencies. The signs and symptoms of potential                            delayed complications were discussed with the                            patient. Return  to normal activities tomorrow.                            Written discharge instructions were provided to the                            patient.                           - Resume previous diet.                           - Continue present medications.                           - Await pathology results.                           - Repeat colonoscopy is recommended for                            surveillance. The colonoscopy date will be                            determined after pathology results from today's                            exam become available for review. Jerene Bears, MD 07/24/2021 8:59:14 AM This report has been signed electronically.

## 2021-07-24 NOTE — Progress Notes (Signed)
Report to PACU, RN, vss, BBS= Clear.  

## 2021-07-25 ENCOUNTER — Telehealth: Payer: Self-pay

## 2021-07-25 NOTE — Telephone Encounter (Signed)
  Follow up Call-     07/24/2021    7:49 AM  Call back number  Post procedure Call Back phone  # [phone]  Permission to leave phone message Yes     Patient questions:  Do you have a fever, pain , or abdominal swelling? No. Pain Score  0 *  Have you tolerated food without any problems? Yes.    Have you been able to return to your normal activities? Yes.    Do you have any questions about your discharge instructions: Diet   No. Medications  No. Follow up visit  No.  Do you have questions or concerns about your Care? No.  Actions: * If pain score is 4 or above: No action needed, pain <4.

## 2021-07-31 ENCOUNTER — Encounter: Payer: Self-pay | Admitting: Internal Medicine

## 2021-08-13 ENCOUNTER — Other Ambulatory Visit: Payer: Self-pay | Admitting: Family Medicine

## 2021-08-13 NOTE — Telephone Encounter (Signed)
Requested Prescriptions  Pending Prescriptions Disp Refills  . pantoprazole (PROTONIX) 40 MG tablet [Pharmacy Med Name: PANTOPRAZOLE SOD DR 40 MG TAB] 90 tablet 3    Sig: TAKE ONE TABLET BY MOUTH DAILY     Gastroenterology: Proton Pump Inhibitors Failed - 08/13/2021  6:20 AM      Failed - Valid encounter within last 12 months    Recent Outpatient Visits          1 month ago General medical exam   [place] Susy Frizzle, MD   1 year ago Esophageal dysphagia   [place] Susy Frizzle, MD   1 year ago GAD (generalized anxiety disorder)   [place] Susy Frizzle, MD   2 years ago Elevated blood pressure reading   [place] Pickard, Cammie Mcgee, MD   3 years ago Family history of factor V Leiden mutation   [place] Pick[place], Cammie Mcgee, MD      Future Appointments            In 1 month [place], [place]

## 2021-09-01 ENCOUNTER — Emergency Department: Payer: 59

## 2021-09-01 ENCOUNTER — Ambulatory Visit: Payer: Self-pay

## 2021-09-01 ENCOUNTER — Other Ambulatory Visit: Payer: Self-pay

## 2021-09-01 ENCOUNTER — Emergency Department
Admission: EM | Admit: 2021-09-01 | Discharge: 2021-09-01 | Disposition: A | Discharge status: Home / Self Care | Payer: 59 | Attending: Emergency Medicine | Admitting: Emergency Medicine

## 2021-09-01 ENCOUNTER — Encounter: Payer: Self-pay | Admitting: Emergency Medicine

## 2021-09-01 DIAGNOSIS — R200 Anesthesia of skin: Secondary | ICD-10-CM | POA: Diagnosis present

## 2021-09-01 DIAGNOSIS — R0789 Other chest pain: Secondary | ICD-10-CM | POA: Insufficient documentation

## 2021-09-01 DIAGNOSIS — R202 Paresthesia of skin: Secondary | ICD-10-CM | POA: Insufficient documentation

## 2021-09-01 DIAGNOSIS — I10 Essential (primary) hypertension: Secondary | ICD-10-CM | POA: Diagnosis not present

## 2021-09-01 DIAGNOSIS — R531 Weakness: Secondary | ICD-10-CM | POA: Diagnosis not present

## 2021-09-01 LAB — PROTIME-INR
INR: 1 (ref 0.8–1.2)
Prothrombin Time: 13.5 seconds (ref 11.4–15.2)

## 2021-09-01 LAB — CBC
HCT: 43.2 % (ref 39.0–52.0)
Hemoglobin: 14.5 g/dL (ref 13.0–17.0)
MCH: 30.3 pg (ref 26.0–34.0)
MCHC: 33.6 g/dL (ref 30.0–36.0)
MCV: 90.4 fL (ref 80.0–100.0)
Platelets: 213 10*3/uL (ref 150–400)
RBC: 4.78 MIL/uL (ref 4.22–5.81)
RDW: 12 % (ref 11.5–15.5)
WBC: 5.4 10*3/uL (ref 4.0–10.5)
nRBC: 0 % (ref 0.0–0.2)

## 2021-09-01 LAB — BASIC METABOLIC PANEL
Anion gap: 7 (ref 5–15)
BUN: 12 mg/dL (ref 8–23)
CO2: 22 mmol/L (ref 22–32)
Calcium: 9.4 mg/dL (ref 8.9–10.3)
Chloride: 109 mmol/L (ref 98–111)
Creatinine, Ser: 1 mg/dL (ref 0.61–1.24)
GFR, Estimated: >60 mL/min (ref >60)
Glucose, Bld: 123 mg/dL — ABNORMAL HIGH (ref 70–99)
Potassium: 3.9 mmol/L (ref 3.5–5.1)
Sodium: 138 mmol/L (ref 135–145)

## 2021-09-01 LAB — TROPONIN I (HIGH SENSITIVITY)
Troponin I (High Sensitivity): 3 ng/L (ref <18)
Troponin I (High Sensitivity): 3 ng/L (ref <18)

## 2021-09-01 LAB — APTT: aPTT: 25 seconds (ref 24–36)

## 2021-09-01 MED ORDER — SODIUM CHLORIDE 0.9% FLUSH: 3.0000 mL | Freq: Once | INTRAVENOUS | Status: DC

## 2021-09-22 ENCOUNTER — Ambulatory Visit: Payer: 59

## 2021-10-03 ENCOUNTER — Ambulatory Visit (INDEPENDENT_AMBULATORY_CARE_PROVIDER_SITE_OTHER): Payer: 59 | Admitting: Family Medicine

## 2021-10-03 VITALS — BP 140/94 | HR 53 | Temp 98.7°F | Ht 69.0 in | Wt 194.0 lb

## 2021-10-03 DIAGNOSIS — G459 Transient cerebral ischemic attack, unspecified: Secondary | ICD-10-CM | POA: Diagnosis not present

## 2021-10-03 NOTE — Progress Notes (Signed)
Subjective:    Patient ID: Casey Stephens, male    DOB: 09/18/1960, 61 y.o.   MRN: [id number]   Patient recently went to the hospital.  He was taking a shower.  He reached for his kind to come in his hair and suddenly his right arm became numb.  He was unable to control his fingers.  This lasted for 20 to 30 minutes.  He states that his right arm was weak and completely numb from his fingertips to his neck.  There was no slurred speech.  There was no facial droop.  There was no symptoms in his leg.  He went to the hospital and a CT scan was negative for any acute intracranial bleed.  Lab work was unremarkable.  The question is whether this was a TIA versus some kind of pinched nerve in his neck.  He states that he was not having any pain in the right arm just numbness and weakness.  He also denies any neck pain  Past Medical History:  Diagnosis Date   Allergy    SEASONAL   Arthritis    BACK,NECK,HANDS   GERD (gastroesophageal reflux disease)    Hyperlipidemia    Hypertension    Past Surgical History:  Procedure Laterality Date   av fistula lt temple     ruptured artery from softball injury   COLONOSCOPY     crushed thumb repair     POLYPECTOMY     testicular biopsy     negative   Current Outpatient Medications on File Prior to Visit  Medication Sig Dispense Refill   ALPRAZolam (XANAX) 0.5 MG tablet Take 1 tablet (0.5 mg total) by mouth 3 (three) times daily as needed. for anxiety 30 tablet 0   aspirin 81 MG tablet Take 81 mg by mouth daily.     cetirizine (ZYRTEC) 10 MG tablet Take 10 mg by mouth as needed for allergies.     escitalopram (LEXAPRO) 10 MG tablet Take 1 tablet (10 mg total) by mouth daily. 90 tablet 3   fish oil-omega-3 fatty acids 1000 MG capsule Take 2 g by mouth daily.     pantoprazole (PROTONIX) 40 MG tablet TAKE ONE TABLET BY MOUTH DAILY 90 tablet 3   rosuvastatin (CRESTOR) 10 MG tablet Take 1 tablet (10 mg total) by mouth daily. 90 tablet 3   valsartan  (DIOVAN) 80 MG tablet Take 1 tablet (80 mg total) by mouth daily. 90 tablet 3   No current facility-administered medications on file prior to visit.   No Known Allergies Social History   Socioeconomic History   Marital status: Divorced    Spouse name: Not on file   Number of children: Not on file   Years of education: Not on file   Highest education level: Not on file  Occupational History   Not on file  Tobacco Use   Smoking status: Former    Packs/day: 0.50    Years: 25.00    Total pack years: 12.50    Types: Cigarettes    Quit date: 01/29/2013    Years since quitting: 8.6   Smokeless tobacco: Never  Vaping Use   Vaping Use: Some days   Substances: CBD  Substance and Sexual Activity   Alcohol use: Yes    Alcohol/week: 1.0 standard drink of alcohol    Types: 1 Cans of beer per week    Comment: 1-2 BEER DAILY   Drug use: No   Sexual activity: Not on file  Other Topics Concern   Not on file  Social History Narrative   Not on file   Social Determinants of Health   Financial Resource Strain: Not on file  Food Insecurity: Not on file  Transportation Needs: Not on file  Physical Activity: Not on file  Stress: Not on file  Social Connections: Not on file  Intimate Partner Violence: Not on file      Review of Systems  All other systems reviewed and are negative.      Objective:   Physical Exam Vitals reviewed.  Constitutional:      General: He is not in acute distress.    Appearance: He is well-developed. He is not diaphoretic.  HENT:     Head: Normocephalic and atraumatic.     Right Ear: External ear normal.     Left Ear: External ear normal.     Nose: Nose normal.     Mouth/Throat:     Pharynx: No oropharyngeal exudate.  Eyes:     General: No scleral icterus.       Right eye: No discharge.        Left eye: No discharge.     Conjunctiva/sclera: Conjunctivae normal.     Pupils: Pupils are equal, round, and reactive to light.  Neck:     Thyroid: No  thyromegaly.     Vascular: No JVD.     Trachea: No tracheal deviation.  Cardiovascular:     Rate and Rhythm: Normal rate and regular rhythm.     Heart sounds: Normal heart sounds. No murmur heard.    No friction rub. No gallop.  Pulmonary:     Effort: Pulmonary effort is normal. No respiratory distress.     Breath sounds: Normal breath sounds. No stridor. No wheezing or rales.  Chest:     Chest wall: No tenderness.  Abdominal:     General: Bowel sounds are normal. There is no distension.     Palpations: Abdomen is soft. There is no mass.     Tenderness: There is no abdominal tenderness. There is no guarding or rebound.  Genitourinary:    Prostate: Normal. Not enlarged and not tender.     Rectum: Normal.  Musculoskeletal:        General: No tenderness.     Cervical back: Normal range of motion and neck supple.  Lymphadenopathy:     Cervical: No cervical adenopathy.  Skin:    General: Skin is warm.     Coloration: Skin is not pale.     Findings: No erythema or rash.  Neurological:     Mental Status: He is alert and oriented to person, place, and time.     Cranial Nerves: No cranial nerve deficit.     Motor: No abnormal muscle tone.     Coordination: Coordination normal.     Deep Tendon Reflexes: Reflexes normal.  Psychiatric:        Behavior: Behavior normal.        Thought Content: Thought content normal.        Judgment: Judgment normal.           Assessment & Plan:   TIA (transient ischemic attack) - Plan: Lipid panel, COMPLETE METABOLIC PANEL WITH GFR, ECHOCARDIOGRAM COMPLETE, US Carotid Duplex Bilateral Patient's symptoms either represent an atypical cervical radiculopathy with no pain occurring suddenly and without neck pain or injury versus a TIA.  I want to assume a TIA.  Schedule the patient for an echocardiogram to rule out  an embolic source.  Schedule him for carotid Dopplers to rule out significant plaque in the carotid artery.  I asked the patient to check  his blood pressure daily.  If consistently greater than 140/90 I will increase valsartan to 160 mg.  Start taking aspirin 81 mg daily.  Check cholesterol.  Goal LDL cholesterol is less than 70

## 2021-10-04 LAB — COMPLETE METABOLIC PANEL WITH GFR
AG Ratio: 2 (calc) (ref 1.0–2.5)
ALT: 30 U/L (ref 9–46)
AST: 21 U/L (ref 10–35)
Albumin: 4.8 g/dL (ref 3.6–5.1)
Alkaline phosphatase (APISO): 76 U/L (ref 35–144)
BUN: 13 mg/dL (ref 7–25)
CO2: 26 mmol/L (ref 20–32)
Calcium: 10.1 mg/dL (ref 8.6–10.3)
Chloride: 105 mmol/L (ref 98–110)
Creat: 1.13 mg/dL (ref 0.70–1.35)
Globulin: 2.4 g/dL (calc) (ref 1.9–3.7)
Glucose, Bld: 92 mg/dL (ref 65–99)
Potassium: 5.3 mmol/L (ref 3.5–5.3)
Sodium: 140 mmol/L (ref 135–146)
Total Bilirubin: 0.8 mg/dL (ref 0.2–1.2)
Total Protein: 7.2 g/dL (ref 6.1–8.1)
eGFR: 74 mL/min/{1.73_m2} (ref >=60)

## 2021-10-04 LAB — LIPID PANEL
Cholesterol: 134 mg/dL (ref <200)
HDL: 69 mg/dL (ref >=40)
LDL Cholesterol (Calc): 43 mg/dL (calc)
Non-HDL Cholesterol (Calc): 65 mg/dL (calc) (ref <130)
Total CHOL/HDL Ratio: 1.9 (calc) (ref <5.0)
Triglycerides: 132 mg/dL (ref <150)

## 2021-10-07 ENCOUNTER — Ambulatory Visit
Admission: RE | Admit: 2021-10-07 | Discharge: 2021-10-07 | Disposition: A | Discharge status: Home / Self Care | Payer: No Typology Code available for payment source | Source: Ambulatory Visit | Attending: Family Medicine | Admitting: Family Medicine

## 2021-10-07 DIAGNOSIS — G459 Transient cerebral ischemic attack, unspecified: Secondary | ICD-10-CM

## 2021-10-08 ENCOUNTER — Encounter: Payer: Self-pay | Admitting: Family Medicine

## 2021-10-10 ENCOUNTER — Ambulatory Visit (HOSPITAL_COMMUNITY): Payer: 59 | Attending: Family Medicine

## 2021-10-10 DIAGNOSIS — G459 Transient cerebral ischemic attack, unspecified: Secondary | ICD-10-CM | POA: Insufficient documentation

## 2021-10-10 LAB — ECHOCARDIOGRAM COMPLETE
Area-P 1/2: 3.03 cm2
S' Lateral: 1.9 cm

## 2021-11-17 ENCOUNTER — Other Ambulatory Visit: Payer: Self-pay | Admitting: Family Medicine

## 2021-11-17 ENCOUNTER — Encounter: Payer: Self-pay | Admitting: Family Medicine

## 2021-11-17 MED ORDER — VALSARTAN 160 MG PO TABS: 160.0000 mg | ORAL_TABLET | Freq: Every day | ORAL | 3 refills | Status: DC

## 2021-12-03 ENCOUNTER — Other Ambulatory Visit: Payer: Self-pay | Admitting: Family Medicine

## 2021-12-03 DIAGNOSIS — F411 Generalized anxiety disorder: Secondary | ICD-10-CM

## 2021-12-04 MED ORDER — ALPRAZOLAM 0.5 MG PO TABS: 0.5000 mg | ORAL_TABLET | Freq: Three times a day (TID) | ORAL | 0 refills | Status: DC | PRN

## 2022-03-16 DIAGNOSIS — M549 Dorsalgia, unspecified: Secondary | ICD-10-CM | POA: Insufficient documentation

## 2022-03-19 ENCOUNTER — Encounter: Payer: Self-pay | Admitting: Family Medicine

## 2022-03-19 ENCOUNTER — Ambulatory Visit (INDEPENDENT_AMBULATORY_CARE_PROVIDER_SITE_OTHER): Payer: 59 | Admitting: Family Medicine

## 2022-03-19 VITALS — BP 124/72 | HR 77 | Ht 69.0 in | Wt 198.0 lb

## 2022-03-19 DIAGNOSIS — M5137 Other intervertebral disc degeneration, lumbosacral region: Secondary | ICD-10-CM

## 2022-03-19 MED ORDER — MELOXICAM 15 MG PO TABS: 15.0000 mg | ORAL_TABLET | Freq: Every day | ORAL | 3 refills | Status: DC

## 2022-03-19 NOTE — Progress Notes (Signed)
Subjective:    Patient ID: Casey Stephens, male    DOB: [birth year], 62 y.o.   MRN: [id number]   Patient has a history of degenerative disc disease.  Reviewed his x-ray from his lumbar spine from 2015.  He has significant degenerative disc disease worse at L4-L5 with anterior listhesis of L5 on L4 with foraminal stenosis at that level.  He also had degenerative disc disease at L5-S1.  He has been dealing with back pain ever since.  Previously he saw [facility] and had epidural steroid injections.  However he lost his insurance and had lost follow-up with him.  He now reports daily low back pain roughly around the level of L4-L5 which will occasionally radiate into his right flank.  He denies any leg weakness.  He denies any saddle anesthesia.  He denies any bowel or bladder incontinence.  He is not taking any NSAIDs Past Medical History:  Diagnosis Date   Allergy    SEASONAL   Arthritis    BACK,NECK,HANDS   GERD (gastroesophageal reflux disease)    Hyperlipidemia    Hypertension    Past Surgical History:  Procedure Laterality Date   av fistula lt temple     ruptured artery from softball injury   COLONOSCOPY     crushed thumb repair     POLYPECTOMY     testicular biopsy     negative   Current Outpatient Medications on File Prior to Visit  Medication Sig Dispense Refill   ALPRAZolam (XANAX) 0.5 MG tablet Take 1 tablet (0.5 mg total) by mouth 3 (three) times daily as needed. for anxiety 30 tablet 0   aspirin 81 MG tablet Take 81 mg by mouth daily.     cetirizine (ZYRTEC) 10 MG tablet Take 10 mg by mouth as needed for allergies.     escitalopram (LEXAPRO) 10 MG tablet Take 1 tablet (10 mg total) by mouth daily. 90 tablet 3   fish oil-omega-3 fatty acids 1000 MG capsule Take 2 g by mouth daily.     pantoprazole (PROTONIX) 40 MG tablet TAKE ONE TABLET BY MOUTH DAILY 90 tablet 3   rosuvastatin (CRESTOR) 10 MG tablet Take 1 tablet (10 mg total) by mouth daily. 90 tablet 3   valsartan  (DIOVAN) 160 MG tablet Take 1 tablet (160 mg total) by mouth daily. 90 tablet 3   No current facility-administered medications on file prior to visit.   No Known Allergies Social History   Socioeconomic History   Marital status: Divorced    Spouse name: Not on file   Number of children: Not on file   Years of education: Not on file   Highest education level: Not on file  Occupational History   Not on file  Tobacco Use   Smoking status: Former    Packs/day: 0.50    Years: 25.00    Total pack years: 12.50    Types: Cigarettes    Quit date: 01/29/2013    Years since quitting: 9.1   Smokeless tobacco: Never  Vaping Use   Vaping Use: Some days   Substances: CBD  Substance and Sexual Activity   Alcohol use: Yes    Alcohol/week: 1.0 standard drink of alcohol    Types: 1 Cans of beer per week    Comment: 1-2 BEER DAILY   Drug use: No   Sexual activity: Not on file  Other Topics Concern   Not on file  Social History Narrative   Not on file  Social Determinants of Health   Financial Resource Strain: Not on file  Food Insecurity: Not on file  Transportation Needs: Not on file  Physical Activity: Not on file  Stress: Not on file  Social Connections: Not on file  Intimate Partner Violence: Not on file      Review of Systems  All other systems reviewed and are negative.      Objective:   Physical Exam Vitals reviewed.  Constitutional:      General: He is not in acute distress.    Appearance: He is well-developed. He is not diaphoretic.  HENT:     Head: Normocephalic and atraumatic.  Eyes:     General: No scleral icterus. Neck:     Thyroid: No thyromegaly.     Vascular: No JVD.     Trachea: No tracheal deviation.  Cardiovascular:     Rate and Rhythm: Normal rate and regular rhythm.     Heart sounds: Normal heart sounds. No murmur heard.    No friction rub. No gallop.  Pulmonary:     Effort: Pulmonary effort is normal. No respiratory distress.     Breath  sounds: Normal breath sounds. No stridor. No wheezing or rales.  Chest:     Chest wall: No tenderness.  Genitourinary:    Prostate: Not enlarged and not tender.  Musculoskeletal:        General: Tenderness present.     Lumbar back: Tenderness and bony tenderness present. Decreased range of motion. Negative right straight leg raise test and negative left straight leg raise test.  Neurological:     Mental Status: He is alert.     Motor: No abnormal muscle tone.  Psychiatric:        Judgment: Judgment normal.           Assessment & Plan:  DDD (degenerative disc disease), lumbosacral - Plan: meloxicam (MOBIC) 15 MG tablet, Ambulatory referral to Orthopedic Surgery Now that the patient has insurance he would like to reestablish with [facility] in [place].  I will place a consultation with them.  Meanwhile I recommended that he try meloxicam 15 mg daily.  If this does not help his back pain he could then proceed with epidural steroid injection

## 2022-03-27 DIAGNOSIS — M545 Low back pain, unspecified: Secondary | ICD-10-CM | POA: Diagnosis not present

## 2022-04-20 ENCOUNTER — Telehealth: Payer: Self-pay

## 2022-04-20 ENCOUNTER — Other Ambulatory Visit: Payer: Self-pay

## 2022-04-20 DIAGNOSIS — F411 Generalized anxiety disorder: Secondary | ICD-10-CM

## 2022-04-20 MED ORDER — ESCITALOPRAM OXALATE 10 MG PO TABS: 10.0000 mg | ORAL_TABLET | Freq: Every day | ORAL | 1 refills | Status: DC

## 2022-04-20 NOTE — Telephone Encounter (Signed)
Prescription Request  04/20/2022  Is this a "Controlled Substance" medicine? No  LOV: 10/03/21  What is the name of the medication or equipment? escitalopram (LEXAPRO) 10 MG tablet [id number]   Have you contacted your pharmacy to request a refill? Yes   Which pharmacy would you like this sent to?  [facility] [id number] [address], [address] [place] [address] Phone: [phone] Fax: [phone]    Patient notified that their request is being sent to the clinical staff for review and that they should receive a response within 2 business days.   Please advise at [facility] [phone]

## 2022-04-22 DIAGNOSIS — M545 Low back pain, unspecified: Secondary | ICD-10-CM | POA: Diagnosis not present

## 2022-05-21 DIAGNOSIS — M545 Low back pain, unspecified: Secondary | ICD-10-CM | POA: Diagnosis not present

## 2022-06-10 DIAGNOSIS — M545 Low back pain, unspecified: Secondary | ICD-10-CM | POA: Diagnosis not present

## 2022-06-22 ENCOUNTER — Other Ambulatory Visit: Payer: Self-pay

## 2022-06-22 ENCOUNTER — Encounter: Payer: Self-pay | Admitting: Family Medicine

## 2022-06-22 ENCOUNTER — Ambulatory Visit (INDEPENDENT_AMBULATORY_CARE_PROVIDER_SITE_OTHER): Payer: 59 | Admitting: Family Medicine

## 2022-06-22 VITALS — BP 132/76 | HR 79 | Temp 98.7°F | Ht 69.0 in | Wt 198.8 lb

## 2022-06-22 DIAGNOSIS — Z0001 Encounter for general adult medical examination with abnormal findings: Secondary | ICD-10-CM

## 2022-06-22 DIAGNOSIS — R030 Elevated blood-pressure reading, without diagnosis of hypertension: Secondary | ICD-10-CM

## 2022-06-22 DIAGNOSIS — R5383 Other fatigue: Secondary | ICD-10-CM

## 2022-06-22 DIAGNOSIS — Z8673 Personal history of transient ischemic attack (TIA), and cerebral infarction without residual deficits: Secondary | ICD-10-CM | POA: Diagnosis not present

## 2022-06-22 DIAGNOSIS — Z0000 Encounter for general adult medical examination without abnormal findings: Secondary | ICD-10-CM

## 2022-06-22 DIAGNOSIS — Z125 Encounter for screening for malignant neoplasm of prostate: Secondary | ICD-10-CM | POA: Diagnosis not present

## 2022-06-22 NOTE — Progress Notes (Signed)
Subjective:    Patient ID: RAMZY CAPPELLETTI, male    DOB: 03-14-1960, 62 y.o.   MRN: [id number] Patient is a very pleasant 62 year old Caucasian gentleman who presents today for complete physical exam.  Had colonoscopy in 2023 with 2 polyps.  One was a tubular adenoma.  Next colonoscopy due in 2030.  Patient does complain of fatigue.  He states that he is physically tired every day.  He denies feeling sleepy.  He states he just does not have as much energy as he would expect.  He has a history of hypergonadism and at one point was taking testosterone.  He has not had his testosterone levels checked in quite some time.  His girlfriend states that she is hurting stop breathing at night on occasion.  He also states that he is a loud snorer.  However he has a very low Epworth sleepiness score.  He denies falling asleep driving in cars or riding in cars.  He denies falling asleep watching TV or reading a book.  He denies falling asleep after lunch.  He states that he may get occasionally sleepy but he never takes a nap.  Therefore his pretest probably sleep apnea is relatively low. Past Medical History:  Diagnosis Date   Allergy    SEASONAL   Arthritis    BACK,NECK,HANDS   GERD (gastroesophageal reflux disease)    Hyperlipidemia    Hypertension    Past Surgical History:  Procedure Laterality Date   av fistula lt temple     ruptured artery from softball injury   COLONOSCOPY     crushed thumb repair     POLYPECTOMY     testicular biopsy     negative   Current Outpatient Medications on File Prior to Visit  Medication Sig Dispense Refill   ALPRAZolam (XANAX) 0.5 MG tablet Take 1 tablet (0.5 mg total) by mouth 3 (three) times daily as needed. for anxiety 30 tablet 0   aspirin 81 MG tablet Take 81 mg by mouth daily.     cetirizine (ZYRTEC) 10 MG tablet Take 10 mg by mouth as needed for allergies.     escitalopram (LEXAPRO) 10 MG tablet Take 1 tablet (10 mg total) by mouth daily. 90 tablet 1    fish oil-omega-3 fatty acids 1000 MG capsule Take 2 g by mouth daily.     meloxicam (MOBIC) 15 MG tablet Take 1 tablet (15 mg total) by mouth daily. 30 tablet 3   pantoprazole (PROTONIX) 40 MG tablet TAKE ONE TABLET BY MOUTH DAILY 90 tablet 3   rosuvastatin (CRESTOR) 10 MG tablet Take 1 tablet (10 mg total) by mouth daily. 90 tablet 3   valsartan (DIOVAN) 160 MG tablet Take 1 tablet (160 mg total) by mouth daily. 90 tablet 3   No current facility-administered medications on file prior to visit.   No Known Allergies Social History   Socioeconomic History   Marital status: Divorced    Spouse name: Not on file   Number of children: Not on file   Years of education: Not on file   Highest education level: Not on file  Occupational History   Not on file  Tobacco Use   Smoking status: Former    Packs/day: 0.50    Years: 25.00    Additional pack years: 0.00    Total pack years: 12.50    Types: Cigarettes    Quit date: 01/29/2013    Years since quitting: 9.4   Smokeless tobacco: Never  Vaping Use   Vaping Use: Some days   Substances: CBD  Substance and Sexual Activity   Alcohol use: Yes    Alcohol/week: 1.0 standard drink of alcohol    Types: 1 Cans of beer per week    Comment: 1-2 BEER DAILY   Drug use: No   Sexual activity: Not on file  Other Topics Concern   Not on file  Social History Narrative   Not on file   Social Determinants of Health   Financial Resource Strain: Not on file  Food Insecurity: Not on file  Transportation Needs: Not on file  Physical Activity: Not on file  Stress: Not on file  Social Connections: Not on file  Intimate Partner Violence: Not on file      Review of Systems  All other systems reviewed and are negative.      Objective:   Physical Exam Vitals reviewed.  Constitutional:      General: He is not in acute distress.    Appearance: He is well-developed. He is not diaphoretic.  HENT:     Head: Normocephalic and atraumatic.      Right Ear: External ear normal.     Left Ear: External ear normal.     Nose: Nose normal.     Mouth/Throat:     Pharynx: No oropharyngeal exudate.  Eyes:     General: No scleral icterus.       Right eye: No discharge.        Left eye: No discharge.     Conjunctiva/sclera: Conjunctivae normal.     Pupils: Pupils are equal, round, and reactive to light.  Neck:     Thyroid: No thyromegaly.     Vascular: No JVD.     Trachea: No tracheal deviation.  Cardiovascular:     Rate and Rhythm: Normal rate and regular rhythm.     Heart sounds: Normal heart sounds. No murmur heard.    No friction rub. No gallop.  Pulmonary:     Effort: Pulmonary effort is normal. No respiratory distress.     Breath sounds: Normal breath sounds. No stridor. No wheezing or rales.  Chest:     Chest wall: No tenderness.  Abdominal:     General: Bowel sounds are normal. There is no distension.     Palpations: Abdomen is soft. There is no mass.     Tenderness: There is no abdominal tenderness. There is no guarding or rebound.  Genitourinary:    Penis: Normal.      Testes:        Right: Tenderness present. Testicular hydrocele or varicocele not present.        Left: Tenderness present. Testicular hydrocele or varicocele not present.     Prostate: Normal. Not enlarged and not tender.     Rectum: Normal.  Musculoskeletal:        General: No tenderness.     Cervical back: Normal range of motion and neck supple.  Lymphadenopathy:     Cervical: No cervical adenopathy.  Skin:    General: Skin is warm.     Coloration: Skin is not pale.     Findings: No erythema or rash.  Neurological:     Mental Status: He is alert and oriented to person, place, and time.     Cranial Nerves: No cranial nerve deficit.     Motor: No abnormal muscle tone.     Coordination: Coordination normal.     Deep Tendon Reflexes: Reflexes normal.  Psychiatric:  Behavior: Behavior normal.        Thought Content: Thought content normal.         Judgment: Judgment normal.           Assessment & Plan:   General medical exam - Plan: CBC with Differential/Platelet, Lipid panel, PSA, COMPLETE METABOLIC PANEL WITH GFR  Prostate cancer screening - Plan: PSA  History of TIA (transient ischemic attack) - Plan: CBC with Differential/Platelet, Lipid panel, COMPLETE METABOLIC PANEL WITH GFR  Fatigue, unspecified type - Plan: Vitamin B12, TSH, Testosterone Total,Free,Bio, Males Patient's blood pressure today is well-controlled on valsartan.  I did ask him to start checking his blood pressure at home to ensure that we have his blood pressure consistently less than 140/90.  Patient had a TIA in the past.  Symptoms include sudden numbness and weakness in his right arm while trying to comb his hair.  He does have a history of cervical degenerative disc disease but this happened suddenly and without warning and resolved after 30 minutes.  Therefore I believe he had a TIA.  He is not currently having any neck pain so we decided to continue the aspirin and the statin.  Ideally I like to keep his LDL cholesterol below 70.  Given his fatigue I will check a testosterone level, B12, and a TSH.  If these are normal, we may consider a home sleep study given the report of his girlfriend.  His immunizations are up-to-date.  His colonoscopy is up-to-date.  Screen for prostate cancer with a PSA

## 2022-06-22 NOTE — Telephone Encounter (Signed)
Prescription Request  06/22/2022  LOV: 06/22/22 CPE   What is the name of the medication or equipment? rosuvastatin (CRESTOR) 10 MG tablet [[id number]]  Have you contacted your pharmacy to request a refill? Yes   Which pharmacy would you like this sent to?  [facility] [id number] [place], [place] - [address] [place] [address] Phone: [phone] Fax: [phone]    Patient notified that their request is being sent to the clinical staff for review and that they should receive a response within 2 business days.   Please advise at [facility] [phone]

## 2022-06-23 ENCOUNTER — Other Ambulatory Visit: Payer: Self-pay | Admitting: Family Medicine

## 2022-06-23 LAB — COMPLETE METABOLIC PANEL WITH GFR
AG Ratio: 1.9 (calc) (ref 1.0–2.5)
ALT: 37 U/L (ref 9–46)
AST: 29 U/L (ref 10–35)
Albumin: 4.9 g/dL (ref 3.6–5.1)
Alkaline phosphatase (APISO): 62 U/L (ref 35–144)
BUN: 15 mg/dL (ref 7–25)
CO2: 22 mmol/L (ref 20–32)
Calcium: 9.7 mg/dL (ref 8.6–10.3)
Chloride: 104 mmol/L (ref 98–110)
Creat: 0.89 mg/dL (ref 0.70–1.35)
Globulin: 2.6 g/dL (calc) (ref 1.9–3.7)
Glucose, Bld: 89 mg/dL (ref 65–99)
Potassium: 4.4 mmol/L (ref 3.5–5.3)
Sodium: 139 mmol/L (ref 135–146)
Total Bilirubin: 1 mg/dL (ref 0.2–1.2)
Total Protein: 7.5 g/dL (ref 6.1–8.1)
eGFR: 97 mL/min/{1.73_m2} (ref >=60)

## 2022-06-23 LAB — CBC WITH DIFFERENTIAL/PLATELET
Absolute Monocytes: 817 cells/uL (ref 200–950)
Basophils Absolute: 69 cells/uL (ref 0–200)
Basophils Relative: 0.8 %
Eosinophils Absolute: 275 cells/uL (ref 15–500)
Eosinophils Relative: 3.2 %
HCT: 44.5 % (ref 38.5–50.0)
Hemoglobin: 14.9 g/dL (ref 13.2–17.1)
Lymphs Abs: 2907 cells/uL (ref 850–3900)
MCH: 30.5 pg (ref 27.0–33.0)
MCHC: 33.5 g/dL (ref 32.0–36.0)
MCV: 91.2 fL (ref 80.0–100.0)
MPV: 10.8 fL (ref 7.5–12.5)
Monocytes Relative: 9.5 %
Neutro Abs: 4532 cells/uL (ref 1500–7800)
Neutrophils Relative %: 52.7 %
Platelets: 239 10*3/uL (ref 140–400)
RBC: 4.88 10*6/uL (ref 4.20–5.80)
RDW: 12.1 % (ref 11.0–15.0)
Total Lymphocyte: 33.8 %
WBC: 8.6 10*3/uL (ref 3.8–10.8)

## 2022-06-23 LAB — PSA: PSA: 0.21 ng/mL (ref <=4.00)

## 2022-06-23 LAB — VITAMIN B12: Vitamin B-12: 396 pg/mL (ref 200–1100)

## 2022-06-23 LAB — TESTOSTERONE TOTAL,FREE,BIO, MALES
Albumin: 4.9 g/dL (ref 3.6–5.1)
Sex Hormone Binding: 28 nmol/L (ref 22–77)
Testosterone: 26 ng/dL — ABNORMAL LOW (ref 250–827)

## 2022-06-23 LAB — LIPID PANEL
Cholesterol: 143 mg/dL (ref <200)
HDL: 69 mg/dL (ref >=40)
LDL Cholesterol (Calc): 50 mg/dL (calc)
Non-HDL Cholesterol (Calc): 74 mg/dL (calc) (ref <130)
Total CHOL/HDL Ratio: 2.1 (calc) (ref <5.0)
Triglycerides: 161 mg/dL — ABNORMAL HIGH (ref <150)

## 2022-06-23 LAB — TSH: TSH: 0.83 mIU/L (ref 0.40–4.50)

## 2022-06-23 MED ORDER — TESTOSTERONE 20.25 MG/ACT (1.62%) TD GEL
2.0000 | Freq: Every day | TRANSDERMAL | 3 refills | Status: DC
Start: 2022-06-23 — End: 2023-04-02

## 2022-06-23 MED ORDER — ROSUVASTATIN CALCIUM 10 MG PO TABS: 10.0000 mg | ORAL_TABLET | Freq: Every day | ORAL | 3 refills | Status: DC

## 2022-06-23 NOTE — Telephone Encounter (Signed)
Requested Prescriptions  Pending Prescriptions Disp Refills   rosuvastatin (CRESTOR) 10 MG tablet 90 tablet 3    Sig: Take 1 tablet (10 mg total) by mouth daily.     Cardiovascular:  Antilipid - Statins 2 Failed - 06/22/2022  9:46 AM      Failed - Valid encounter within last 12 months    Recent Outpatient Visits           1 year ago General medical exam   [facility] Donita Brooks, MD   1 year ago Esophageal dysphagia   [facility] Tanya Nones, Priscille Heidelberg, MD   2 years ago GAD (generalized anxiety disorder)   [facility] Donita Brooks, MD   3 years ago Elevated blood pressure reading   [facility] Pickard, Priscille Heidelberg, MD   4 years ago Family history of factor V Leiden mutation   [facility] Pickard, Priscille Heidelberg, MD       Future Appointments             In 1 year Pickard, Priscille Heidelberg, MD [facility], [facility]            Failed - Lipid Panel in normal range within the last 12 months    Cholesterol  Date Value Ref Range Status  06/22/2022 143 <200 mg/dL Final   LDL Cholesterol (Calc)  Date Value Ref Range Status  06/22/2022 50 mg/dL (calc) Final    Comment:    Reference range: <100 . Desirable range <100 mg/dL for primary prevention;   <70 mg/dL for patients with CHD or diabetic patients  with > or = 2 CHD risk factors. Marland Kitchen LDL-C is now calculated using the Martin-Hopkins  calculation, which is a validated novel method providing  better accuracy than the Friedewald equation in the  estimation of LDL-C.  Horald Pollen et al. Lenox Ahr. 1610;960(45): 2061-2068  (http://education.[employer].com/faq/FAQ164)    HDL  Date Value Ref Range Status  06/22/2022 69 > OR = 40 mg/dL Final   Triglycerides  Date Value Ref Range Status  06/22/2022 161 (H) <150 mg/dL Final         Passed - Cr in normal range and within 360 days    Creat  Date Value Ref Range Status   06/22/2022 0.89 0.70 - 1.35 mg/dL Final         Passed - Patient is not pregnant

## 2022-06-24 DIAGNOSIS — M545 Low back pain, unspecified: Secondary | ICD-10-CM | POA: Diagnosis not present

## 2022-07-15 DIAGNOSIS — M5416 Radiculopathy, lumbar region: Secondary | ICD-10-CM | POA: Diagnosis not present

## 2022-07-15 DIAGNOSIS — M545 Low back pain, unspecified: Secondary | ICD-10-CM | POA: Diagnosis not present

## 2022-07-21 ENCOUNTER — Other Ambulatory Visit: Payer: Self-pay | Admitting: Family Medicine

## 2022-07-21 DIAGNOSIS — M5137 Other intervertebral disc degeneration, lumbosacral region: Secondary | ICD-10-CM

## 2022-07-22 NOTE — Telephone Encounter (Signed)
Requested Prescriptions  Pending Prescriptions Disp Refills   meloxicam (MOBIC) 15 MG tablet [Pharmacy Med Name: MELOXICAM 15 MG TABLET] 90 tablet 3    Sig: TAKE 1 TABLET BY MOUTH DAILY     Analgesics:  COX2 Inhibitors Failed - 07/21/2022  6:47 PM      Failed - Manual Review: Labs are only required if the patient has taken medication for more than 8 weeks.      Failed - Valid encounter within last 12 months    Recent Outpatient Visits           1 year ago General medical exam   [facility] Donita Brooks, MD   1 year ago Esophageal dysphagia   [facility] Donita Brooks, MD   2 years ago GAD (generalized anxiety disorder)   [facility] Tanya Nones, Priscille Heidelberg, MD   3 years ago Elevated blood pressure reading   [facility] Pickard, Priscille Heidelberg, MD   4 years ago Family history of factor V Leiden mutation   [facility] Pickard, Priscille Heidelberg, MD       Future Appointments             In 11 months Pickard, Priscille Heidelberg, MD [facility], [facility]            Passed - HGB in normal range and within 360 days    Hemoglobin  Date Value Ref Range Status  06/22/2022 14.9 13.2 - 17.1 g/dL Final         Passed - Cr in normal range and within 360 days    Creat  Date Value Ref Range Status  06/22/2022 0.89 0.70 - 1.35 mg/dL Final         Passed - HCT in normal range and within 360 days    HCT  Date Value Ref Range Status  06/22/2022 44.5 38.5 - 50.0 % Final         Passed - AST in normal range and within 360 days    AST  Date Value Ref Range Status  06/22/2022 29 10 - 35 U/L Final         Passed - ALT in normal range and within 360 days    ALT  Date Value Ref Range Status  06/22/2022 37 9 - 46 U/L Final         Passed - eGFR is 30 or above and within 360 days    GFR, Est African American  Date Value Ref Range Status  10/27/2018 96 > OR = 60 mL/min/1.68m2 Final   GFR calc  Af Amer  Date Value Ref Range Status  09/12/2019 >60 >60 mL/min Final   GFR, Est Non African American  Date Value Ref Range Status  10/27/2018 83 > OR = 60 mL/min/1.38m2 Final   GFR, Estimated  Date Value Ref Range Status  09/01/2021 >60 >60 mL/min Final    Comment:    (NOTE) Calculated using the CKD-EPI Creatinine Equation (2021)    eGFR  Date Value Ref Range Status  06/22/2022 97 > OR = 60 mL/min/1.4m2 Final         Passed - Patient is not pregnant

## 2022-07-30 DIAGNOSIS — M545 Low back pain, unspecified: Secondary | ICD-10-CM | POA: Diagnosis not present

## 2022-07-30 DIAGNOSIS — Z87821 Personal history of retained foreign body fully removed: Secondary | ICD-10-CM | POA: Diagnosis not present

## 2022-08-07 DIAGNOSIS — M5416 Radiculopathy, lumbar region: Secondary | ICD-10-CM | POA: Diagnosis not present

## 2022-08-30 ENCOUNTER — Other Ambulatory Visit: Payer: Self-pay | Admitting: Family Medicine

## 2022-09-15 ENCOUNTER — Other Ambulatory Visit: Payer: Self-pay | Admitting: Family Medicine

## 2022-09-15 DIAGNOSIS — F411 Generalized anxiety disorder: Secondary | ICD-10-CM

## 2022-09-15 MED ORDER — ALPRAZOLAM 0.5 MG PO TABS: 0.5000 mg | ORAL_TABLET | Freq: Three times a day (TID) | ORAL | 0 refills | Status: DC | PRN

## 2022-09-15 NOTE — Telephone Encounter (Signed)
Prescription Request  09/15/2022  LOV: 06/22/2022  What is the name of the medication or equipment? ALPRAZolam (XANAX) 0.5 MG tablet    Have you contacted your pharmacy to request a refill? Yes   Which pharmacy would you like this sent to?  [facility] [id number] [address], [place] - [address] [place] [address] Phone: [phone] Fax: [phone]    Patient notified that their request is being sent to the clinical staff for review and that they should receive a response within 2 business days.   Please advise at [facility] [phone]

## 2022-09-15 NOTE — Telephone Encounter (Signed)
Requested medication (s) are due for refill today - yes  Requested medication (s) are on the active medication list -yes  Future visit scheduled -yes  Last refill: 12/04/21 #30  Notes to clinic: non delegated Rx  Requested Prescriptions  Pending Prescriptions Disp Refills   ALPRAZolam (XANAX) 0.5 MG tablet 30 tablet 0    Sig: Take 1 tablet (0.5 mg total) by mouth 3 (three) times daily as needed. for anxiety     Not Delegated - Psychiatry: Anxiolytics/Hypnotics 2 Failed - 09/15/2022 10:27 AM      Failed - This refill cannot be delegated      Failed - Urine Drug Screen completed in last 360 days      Failed - Valid encounter within last 6 months    Recent Outpatient Visits           1 year ago General medical exam   [facility] Donita Brooks, MD   2 years ago Esophageal dysphagia   [facility] Tanya Nones, Priscille Heidelberg, MD   2 years ago GAD (generalized anxiety disorder)   [facility] Donita Brooks, MD   3 years ago Elevated blood pressure reading   [facility] Pickard, Priscille Heidelberg, MD   4 years ago Family history of factor V Leiden mutation   [facility] Pickard, Priscille Heidelberg, MD       Future Appointments             In 9 months Pickard, Priscille Heidelberg, MD [facility], [facility]            Passed - Patient is not pregnant         Requested Prescriptions  Pending Prescriptions Disp Refills   ALPRAZolam (XANAX) 0.5 MG tablet 30 tablet 0    Sig: Take 1 tablet (0.5 mg total) by mouth 3 (three) times daily as needed. for anxiety     Not Delegated - Psychiatry: Anxiolytics/Hypnotics 2 Failed - 09/15/2022 10:27 AM      Failed - This refill cannot be delegated      Failed - Urine Drug Screen completed in last 360 days      Failed - Valid encounter within last 6 months    Recent Outpatient Visits           1 year ago General medical exam   [facility]  Donita Brooks, MD   2 years ago Esophageal dysphagia   [facility] Donita Brooks, MD   2 years ago GAD (generalized anxiety disorder)   [facility] Donita Brooks, MD   3 years ago Elevated blood pressure reading   [facility] Pickard, Priscille Heidelberg, MD   4 years ago Family history of factor V Leiden mutation   [facility] Pickard, Priscille Heidelberg, MD       Future Appointments             In 9 months Pickard, Priscille Heidelberg, MD [facility], [facility]            Passed - Patient is not pregnant

## 2022-09-17 DIAGNOSIS — M5416 Radiculopathy, lumbar region: Secondary | ICD-10-CM | POA: Diagnosis not present

## 2022-09-17 DIAGNOSIS — M47896 Other spondylosis, lumbar region: Secondary | ICD-10-CM | POA: Diagnosis not present

## 2022-09-17 DIAGNOSIS — M5136 Other intervertebral disc degeneration, lumbar region: Secondary | ICD-10-CM | POA: Diagnosis not present

## 2022-09-22 ENCOUNTER — Other Ambulatory Visit: Payer: 59

## 2022-09-23 ENCOUNTER — Other Ambulatory Visit: Payer: 59

## 2022-09-23 DIAGNOSIS — R5383 Other fatigue: Secondary | ICD-10-CM

## 2022-09-24 LAB — TESTOSTERONE: Testosterone: 121 ng/dL — ABNORMAL LOW (ref 250–827)

## 2022-10-18 ENCOUNTER — Other Ambulatory Visit: Payer: Self-pay | Admitting: Family Medicine

## 2022-10-18 DIAGNOSIS — F411 Generalized anxiety disorder: Secondary | ICD-10-CM

## 2022-10-20 NOTE — Telephone Encounter (Signed)
Requested Prescriptions  Pending Prescriptions Disp Refills   escitalopram (LEXAPRO) 10 MG tablet [Pharmacy Med Name: ESCITALOPRAM 10 MG TABLET] 90 tablet 0    Sig: TAKE 1 TABLET BY MOUTH DAILY     Psychiatry:  Antidepressants - SSRI Failed - 10/18/2022  1:01 PM      Failed - Valid encounter within last 6 months    Recent Outpatient Visits           1 year ago General medical exam   [facility] Donita Brooks, MD   2 years ago Esophageal dysphagia   [facility] Donita Brooks, MD   2 years ago GAD (generalized anxiety disorder)   [facility] Donita Brooks, MD   3 years ago Elevated blood pressure reading   [facility] Pickard, Priscille Heidelberg, MD   4 years ago Family history of factor V Leiden mutation   [facility] Pickard, Priscille Heidelberg, MD       Future Appointments             In 8 months Pickard, Priscille Heidelberg, MD [facility], [facility]

## 2022-12-15 ENCOUNTER — Other Ambulatory Visit: Payer: Self-pay | Admitting: Family Medicine

## 2022-12-15 MED ORDER — VALSARTAN 160 MG PO TABS: 160.0000 mg | ORAL_TABLET | Freq: Every day | ORAL | 3 refills | Status: DC

## 2022-12-15 NOTE — Telephone Encounter (Signed)
Requested Prescriptions  Pending Prescriptions Disp Refills   valsartan (DIOVAN) 160 MG tablet 90 tablet 3    Sig: Take 1 tablet (160 mg total) by mouth daily.     Cardiovascular:  Angiotensin Receptor Blockers Failed - 12/15/2022  9:20 AM      Failed - Valid encounter within last 6 months    Recent Outpatient Visits           1 year ago General medical exam   [facility] Donita Brooks, MD   2 years ago Esophageal dysphagia   [facility] Tanya Nones, Priscille Heidelberg, MD   2 years ago GAD (generalized anxiety disorder)   [facility] Donita Brooks, MD   4 years ago Elevated blood pressure reading   [facility] Pickard, Priscille Heidelberg, MD   4 years ago Family history of factor V Leiden mutation   [facility] Pickard, Priscille Heidelberg, MD       Future Appointments             In 6 months Pickard, Priscille Heidelberg, MD [facility], [facility]            Passed - Cr in normal range and within 180 days    Creat  Date Value Ref Range Status  06/22/2022 0.89 0.70 - 1.35 mg/dL Final         Passed - K in normal range and within 180 days    Potassium  Date Value Ref Range Status  06/22/2022 4.4 3.5 - 5.3 mmol/L Final         Passed - Patient is not pregnant      Passed - Last BP in normal range    BP Readings from Last 1 Encounters:  06/22/22 132/76

## 2022-12-15 NOTE — Telephone Encounter (Signed)
Prescription Request  12/15/2022  LOV: 06/22/2022  What is the name of the medication or equipment? valsartan (DIOVAN) 160 MG tablet   Have you contacted your pharmacy to request a refill? Yes   Which pharmacy would you like this sent to?  [facility] [id number] [place], [place] - [address] [place] [address] Phone: [phone] Fax: [phone]    Patient notified that their request is being sent to the clinical staff for review and that they should receive a response within 2 business days.   Please advise at [facility] [phone]

## 2023-01-17 ENCOUNTER — Other Ambulatory Visit: Payer: Self-pay | Admitting: Family Medicine

## 2023-01-17 DIAGNOSIS — F411 Generalized anxiety disorder: Secondary | ICD-10-CM

## 2023-01-19 NOTE — Telephone Encounter (Signed)
Requested medication (s) are due for refill today:   Yes  Requested medication (s) are on the active medication list:   Yes  Future visit scheduled:   Yes 06/28/2023   LOV 06/22/2022   Last ordered: 10/20/2022 #90, 0 refills  Returned because a 6 month visit is needed unless Dr. Tanya Nones only sees him yearly.    Unable to determine.    Does Dr. Tanya Nones want to see him again before April appt?   Requested Prescriptions  Pending Prescriptions Disp Refills   escitalopram (LEXAPRO) 10 MG tablet [Pharmacy Med Name: ESCITALOPRAM 10 MG TABLET] 30 tablet     Sig: TAKE 1 TABLET BY MOUTH DAILY     Psychiatry:  Antidepressants - SSRI Failed - 01/17/2023 11:53 AM      Failed - Valid encounter within last 6 months    Recent Outpatient Visits           1 year ago General medical exam   [facility] Donita Brooks, MD   2 years ago Esophageal dysphagia   [facility] Donita Brooks, MD   3 years ago GAD (generalized anxiety disorder)   [facility] Donita Brooks, MD   4 years ago Elevated blood pressure reading   [facility] Pickard, Priscille Heidelberg, MD   5 years ago Family history of factor V Leiden mutation   [facility] Pickard, Priscille Heidelberg, MD       Future Appointments             In 5 months Pickard, Priscille Heidelberg, MD [facility], [facility]

## 2023-01-21 ENCOUNTER — Telehealth: Payer: Self-pay

## 2023-01-21 ENCOUNTER — Other Ambulatory Visit: Payer: Self-pay | Admitting: Family Medicine

## 2023-01-21 DIAGNOSIS — F411 Generalized anxiety disorder: Secondary | ICD-10-CM

## 2023-01-21 MED ORDER — ESCITALOPRAM OXALATE 10 MG PO TABS: 10.0000 mg | ORAL_TABLET | Freq: Every day | ORAL | 1 refills | Status: DC

## 2023-01-21 NOTE — Telephone Encounter (Signed)
Refill sent. [id number]  Copied from CRM [id number]. Topic: Clinical - Medication Refill >> Jan 21, 2023 10:00 AM Casey Stephens wrote: PT has been waiting for his Lexapro prescription and it is taking so long to come to his pharmacy as it never takes this long and he is looking for an update.

## 2023-01-25 ENCOUNTER — Ambulatory Visit: Payer: 59 | Admitting: Family Medicine

## 2023-01-25 MED ORDER — ESCITALOPRAM OXALATE 10 MG PO TABS: 10.0000 mg | ORAL_TABLET | Freq: Every day | ORAL | 0 refills | Status: DC

## 2023-01-28 DIAGNOSIS — M5416 Radiculopathy, lumbar region: Secondary | ICD-10-CM | POA: Diagnosis not present

## 2023-01-28 DIAGNOSIS — M47896 Other spondylosis, lumbar region: Secondary | ICD-10-CM | POA: Diagnosis not present

## 2023-01-28 DIAGNOSIS — M7918 Myalgia, other site: Secondary | ICD-10-CM | POA: Insufficient documentation

## 2023-01-28 DIAGNOSIS — M51362 Other intervertebral disc degeneration, lumbar region with discogenic back pain and lower extremity pain: Secondary | ICD-10-CM | POA: Diagnosis not present

## 2023-01-28 DIAGNOSIS — M791 Myalgia, unspecified site: Secondary | ICD-10-CM | POA: Diagnosis not present

## 2023-02-18 ENCOUNTER — Other Ambulatory Visit: Payer: Self-pay

## 2023-02-18 ENCOUNTER — Emergency Department: Payer: 59

## 2023-02-18 ENCOUNTER — Encounter: Payer: Self-pay | Admitting: Emergency Medicine

## 2023-02-18 ENCOUNTER — Emergency Department
Admission: EM | Admit: 2023-02-18 | Discharge: 2023-02-18 | Disposition: A | Discharge status: Home / Self Care | Payer: 59 | Attending: Emergency Medicine | Admitting: Emergency Medicine

## 2023-02-18 DIAGNOSIS — M51379 Other intervertebral disc degeneration, lumbosacral region without mention of lumbar back pain or lower extremity pain: Secondary | ICD-10-CM | POA: Diagnosis not present

## 2023-02-18 DIAGNOSIS — K5730 Diverticulosis of large intestine without perforation or abscess without bleeding: Secondary | ICD-10-CM | POA: Diagnosis not present

## 2023-02-18 DIAGNOSIS — I10 Essential (primary) hypertension: Secondary | ICD-10-CM | POA: Diagnosis not present

## 2023-02-18 DIAGNOSIS — K429 Umbilical hernia without obstruction or gangrene: Secondary | ICD-10-CM | POA: Diagnosis not present

## 2023-02-18 DIAGNOSIS — K5732 Diverticulitis of large intestine without perforation or abscess without bleeding: Secondary | ICD-10-CM | POA: Insufficient documentation

## 2023-02-18 DIAGNOSIS — M5126 Other intervertebral disc displacement, lumbar region: Secondary | ICD-10-CM | POA: Diagnosis not present

## 2023-02-18 DIAGNOSIS — N433 Hydrocele, unspecified: Secondary | ICD-10-CM | POA: Diagnosis not present

## 2023-02-18 DIAGNOSIS — K5792 Diverticulitis of intestine, part unspecified, without perforation or abscess without bleeding: Secondary | ICD-10-CM

## 2023-02-18 DIAGNOSIS — M47816 Spondylosis without myelopathy or radiculopathy, lumbar region: Secondary | ICD-10-CM | POA: Diagnosis not present

## 2023-02-18 DIAGNOSIS — R1032 Left lower quadrant pain: Secondary | ICD-10-CM | POA: Diagnosis not present

## 2023-02-18 DIAGNOSIS — M51369 Other intervertebral disc degeneration, lumbar region without mention of lumbar back pain or lower extremity pain: Secondary | ICD-10-CM | POA: Diagnosis not present

## 2023-02-18 LAB — URINALYSIS, ROUTINE W REFLEX MICROSCOPIC
Bacteria, UA: NONE SEEN
Bilirubin Urine: NEGATIVE
Glucose, UA: NEGATIVE mg/dL
Hgb urine dipstick: NEGATIVE
Ketones, ur: 20 mg/dL — AB
Leukocytes,Ua: NEGATIVE
Nitrite: NEGATIVE
Protein, ur: 100 mg/dL — AB
Specific Gravity, Urine: 1.02 (ref 1.005–1.030)
Squamous Epithelial / HPF: 0 /[HPF] (ref 0–5)
pH: 9 — ABNORMAL HIGH (ref 5.0–8.0)

## 2023-02-18 LAB — COMPREHENSIVE METABOLIC PANEL
ALT: 38 U/L (ref 0–44)
AST: 35 U/L (ref 15–41)
Albumin: 4.5 g/dL (ref 3.5–5.0)
Alkaline Phosphatase: 53 U/L (ref 38–126)
Anion gap: 11 (ref 5–15)
BUN: 16 mg/dL (ref 8–23)
CO2: 20 mmol/L — ABNORMAL LOW (ref 22–32)
Calcium: 9.3 mg/dL (ref 8.9–10.3)
Chloride: 104 mmol/L (ref 98–111)
Creatinine, Ser: 1.11 mg/dL (ref 0.61–1.24)
GFR, Estimated: >60 mL/min (ref >60)
Glucose, Bld: 126 mg/dL — ABNORMAL HIGH (ref 70–99)
Potassium: 3.8 mmol/L (ref 3.5–5.1)
Sodium: 135 mmol/L (ref 135–145)
Total Bilirubin: 1.5 mg/dL — ABNORMAL HIGH (ref <1.2)
Total Protein: 7.3 g/dL (ref 6.5–8.1)

## 2023-02-18 LAB — LIPASE, BLOOD: Lipase: 34 U/L (ref 11–51)

## 2023-02-18 LAB — CBC
HCT: 43.1 % (ref 39.0–52.0)
Hemoglobin: 15.2 g/dL (ref 13.0–17.0)
MCH: 31.3 pg (ref 26.0–34.0)
MCHC: 35.3 g/dL (ref 30.0–36.0)
MCV: 88.9 fL (ref 80.0–100.0)
Platelets: 210 10*3/uL (ref 150–400)
RBC: 4.85 MIL/uL (ref 4.22–5.81)
RDW: 11.6 % (ref 11.5–15.5)
WBC: 7.8 10*3/uL (ref 4.0–10.5)
nRBC: 0 % (ref 0.0–0.2)

## 2023-02-18 MED ORDER — METHOCARBAMOL 500 MG PO TABS
500.0000 mg | ORAL_TABLET | Freq: Once | ORAL | Status: AC
  Administered 2023-02-18: 500 mg via ORAL
  Filled 2023-02-18: qty 1

## 2023-02-18 MED ORDER — DEXAMETHASONE SODIUM PHOSPHATE 10 MG/ML IJ SOLN
8.0000 mg | Freq: Once | INTRAMUSCULAR | Status: AC
  Administered 2023-02-18: 8 mg via INTRAVENOUS
  Filled 2023-02-18: qty 1

## 2023-02-18 MED ORDER — IOHEXOL 300 MG/ML  SOLN
100.0000 mL | Freq: Once | INTRAMUSCULAR | Status: AC | PRN
  Administered 2023-02-18: 100 mL via INTRAVENOUS

## 2023-02-18 MED ORDER — AMOXICILLIN-POT CLAVULANATE 875-125 MG PO TABS
1.0000 | ORAL_TABLET | Freq: Once | ORAL | Status: AC
Start: 2023-02-18 — End: 2023-02-18
  Administered 2023-02-18: 1 via ORAL
  Filled 2023-02-18: qty 1

## 2023-02-18 MED ORDER — KETOROLAC TROMETHAMINE 15 MG/ML IJ SOLN
15.0000 mg | Freq: Once | INTRAMUSCULAR | Status: AC
  Administered 2023-02-18: 15 mg via INTRAVENOUS
  Filled 2023-02-18: qty 1

## 2023-02-18 MED ORDER — OXYCODONE-ACETAMINOPHEN 5-325 MG PO TABS
1.0000 | ORAL_TABLET | Freq: Once | ORAL | Status: AC
  Administered 2023-02-18: 1 via ORAL
  Filled 2023-02-18: qty 1

## 2023-02-18 MED ORDER — OXYCODONE HCL 5 MG PO TABS: 5.0000 mg | ORAL_TABLET | Freq: Three times a day (TID) | ORAL | 0 refills | Status: AC | PRN

## 2023-02-18 MED ORDER — AMOXICILLIN-POT CLAVULANATE 875-125 MG PO TABS: 1.0000 | ORAL_TABLET | Freq: Two times a day (BID) | ORAL | 0 refills | Status: AC

## 2023-02-18 MED ORDER — LIDOCAINE 5 % EX PTCH: 1.0000 | MEDICATED_PATCH | CUTANEOUS | 0 refills | Status: AC

## 2023-02-18 MED ORDER — LIDOCAINE 5 % EX PTCH
1.0000 | MEDICATED_PATCH | Freq: Once | CUTANEOUS | Status: DC
  Administered 2023-02-18: 1 via TRANSDERMAL
  Filled 2023-02-18: qty 1

## 2023-02-18 MED ORDER — FENTANYL CITRATE PF 50 MCG/ML IJ SOSY
100.0000 ug | PREFILLED_SYRINGE | Freq: Once | INTRAMUSCULAR | Status: AC
  Administered 2023-02-18: 100 ug via INTRAVENOUS
  Filled 2023-02-18: qty 2

## 2023-02-18 MED ORDER — MORPHINE SULFATE (PF) 4 MG/ML IV SOLN
4.0000 mg | Freq: Once | INTRAVENOUS | Status: AC
  Administered 2023-02-18: 4 mg via INTRAVENOUS
  Filled 2023-02-18: qty 1

## 2023-02-18 NOTE — ED Provider Notes (Signed)
[facility] Provider Note    Event Date/Time   First MD Initiated Contact with Patient 02/18/23 [phone]     (approximate)   History   Leg Pain and Groin Pain   HPI Casey Stephens is a 62 y.o. male with history of HTN, HLD, anxiety presenting today for groin pain.  Patient states over the last 24 hours severe worsening of pain in his left lower quadrant, left groin, and radiating down his left leg.  He states he has had symptoms like this on and off for the past month but this feels different than before.  Does state some pain in his left scrotum.  Denies nausea, vomiting, upper abdominal pain, dysuria, constipation, diarrhea.  No recent trauma to his back.  Chart review: Patient seen frequently in the past for lumbar radiculopathy with multilevel degenerative changes at L3-L4, L2-L3, L5-S1.     Physical Exam   Triage Vital Signs: ED Triage Vitals [02/18/23 0728]  Encounter Vitals Group     BP 138/87     Systolic BP Percentile      Diastolic BP Percentile      Pulse Rate 62     Resp (!) 22     Temp 98.1 F (36.7 C)     Temp Source Oral     SpO2 100 %     Weight 195 lb (88.5 kg)     Height 5\' 8"  (1.727 m)     Head Circumference      Peak Flow      Pain Score 9     Pain Loc      Pain Education      Exclude from Growth Chart     Most recent vital signs: Vitals:   02/18/23 0728  BP: 138/87  Pulse: 62  Resp: (!) 22  Temp: 98.1 F (36.7 C)  SpO2: 100%   Physical Exam: I have reviewed the vital signs and nursing notes. General: Awake, alert, no acute distress.  Nontoxic appearing. Head:  Atraumatic, normocephalic.   ENT:  EOM intact, PERRL. Oral mucosa is pink and moist with no lesions. Neck: Neck is supple with full range of motion, No meningeal signs. Cardiovascular:  RRR, No murmurs. Peripheral pulses palpable and equal bilaterally. Respiratory:  Symmetrical chest wall expansion.  No rhonchi, rales, or wheezes.  Good air movement  throughout.  No use of accessory muscles.   Musculoskeletal:  No cyanosis or edema. Moving extremities with full ROM.  No tenderness to C, T, or L-spine.  Mild tenderness to left paraspinal muscles.  Positive straight leg raise on left side Abdomen:  Soft, tenderness to palpation in left lower quadrant and left groin, nondistended. GU: Tenderness palpation around left testicle.  No obvious erythema or edema Neuro:  GCS 15, moving all four extremities, interacting appropriately. Speech clear. Psych:  Calm, appropriate.   Skin:  Warm, dry, no rash.    ED Results / Procedures / Treatments   Labs (all labs ordered are listed, but only abnormal results are displayed) Labs Reviewed  COMPREHENSIVE METABOLIC PANEL - Abnormal; Notable for the following components:      Result Value   CO2 20 (*)    Glucose, Bld 126 (*)    Total Bilirubin 1.5 (*)    All other components within normal limits  URINALYSIS, ROUTINE W REFLEX MICROSCOPIC - Abnormal; Notable for the following components:   Color, Urine YELLOW (*)    APPearance CLEAR (*)    pH 9.0 (*)  Ketones, ur 20 (*)    Protein, ur 100 (*)    All other components within normal limits  LIPASE, BLOOD  CBC     EKG    RADIOLOGY Independently interpreted CT imaging with concern for possible diverticulitis as well as disc herniation   PROCEDURES:  Critical Care performed: No  Procedures   MEDICATIONS ORDERED IN ED: Medications  lidocaine (LIDODERM) 5 % 1 patch (has no administration in time range)  methocarbamol (ROBAXIN) tablet 500 mg (has no administration in time range)  fentaNYL (SUBLIMAZE) injection 100 mcg (has no administration in time range)  amoxicillin-clavulanate (AUGMENTIN) 875-125 MG per tablet 1 tablet (has no administration in time range)  dexamethasone (DECADRON) injection 8 mg (has no administration in time range)  morphine (PF) 4 MG/ML injection 4 mg (4 mg Intravenous Given 02/18/23 0830)  ketorolac (TORADOL) 15  MG/ML injection 15 mg (15 mg Intravenous Given 02/18/23 0830)  iohexol (OMNIPAQUE) 300 MG/ML solution 100 mL (100 mLs Intravenous Contrast Given 02/18/23 0920)  oxyCODONE-acetaminophen (PERCOCET/ROXICET) 5-325 MG per tablet 1 tablet (1 tablet Oral Given 02/18/23 1000)     IMPRESSION / MDM / ASSESSMENT AND PLAN / ED COURSE  I reviewed the triage vital signs and the nursing notes.                              Differential diagnosis includes, but is not limited to, acute on chronic low back pain, sciatica, muscular strain, nerve impingement, testicular torsion, diverticulitis, hernia  Patient's presentation is most consistent with acute complicated illness / injury requiring diagnostic workup.  Patient is a 62 year old male presenting today for left-sided abdomen, groin, and leg pain.  He does have a history of lumbar radiculopathy although this pain appears more prominent on the front side with no associated abdominal pain.  While this may simply just be his acute on chronic low back pain symptoms, abdominal pain and testicular pain are new and will evaluate for further pathology related to those acutely.  Will get CT abdomen/pelvis as well as scrotal ultrasound.  Patient will be given morphine and Toradol for pain symptoms.  CT abdomen/pelvis shows evidence of possible diverticulitis in the left lower quadrant which would correlate with his symptoms.  Scrotal ultrasound negative.  A lumbar spine CT with evidence of disc herniation seen on prior imaging.  This also seems to correlate along with his pain symptoms right now.  Patient was given additional pain medication with adequate control of his pain here in the ED.  Vital signs otherwise stable and able to ambulate.  Will discharge with Augmentin to treat his diverticulitis and have him follow-up with his orthopedic team for ongoing care.  Patient agreeable with plan given strict return precautions.  Clinical Course as of 02/18/23 1107  Thu Feb 18, 2023  1001 CT ABDOMEN PELVIS W CONTRAST Findings are equivocal for mild diverticulitis. [DW]  1022 CT L-SPINE NO CHARGE Disc protrusion at L3-L4 [DW]    Clinical Course User Index [DW] Janith Lima, MD     FINAL CLINICAL IMPRESSION(S) / ED DIAGNOSES   Final diagnoses:  Diverticulitis  Lumbar disc herniation     Rx / DC Orders   ED Discharge Orders          Ordered    oxyCODONE (ROXICODONE) 5 MG immediate release tablet  Every 8 hours PRN        02/18/23 1106    lidocaine (LIDODERM) 5 %  Every 24 hours        02/18/23 1106    amoxicillin-clavulanate (AUGMENTIN) 875-125 MG tablet  2 times daily        02/18/23 1106             Note:  This document was prepared using Dragon voice recognition software and may include unintentional dictation errors.   Janith Lima, MD 02/18/23 [phone]

## 2023-02-18 NOTE — ED Notes (Signed)
Pt to US.

## 2023-02-18 NOTE — ED Triage Notes (Signed)
Pt to ED via POV c/o left thigh pain that radiates into his groin. Pt states that he is also having pain in his lower back that radiates into his lower abdomen. Pt states that he was having the pain in his leg last night but it eased off. Pt states that he recently took a prednisone taper for the back pain and that pain got significantly better. Pt states that the pain in the leg started again this morning around 0100 and has continued to get worse. Pt states that he has been having dry heaves due to the pain.

## 2023-02-18 NOTE — Discharge Instructions (Addendum)
Please pick up the antibiotic from the pharmacy and take as prescribed.  Please follow-up with your orthopedic team in the next several days for ongoing evaluation of your disc herniation and discuss potential surgery.  I would also recommend taking ibuprofen 600 mg every 8 hours and Tylenol 1000 mg every 6 hours for the next 5 days.  Please take these with food.

## 2023-02-22 ENCOUNTER — Other Ambulatory Visit: Payer: Self-pay | Admitting: Family Medicine

## 2023-02-22 DIAGNOSIS — F411 Generalized anxiety disorder: Secondary | ICD-10-CM

## 2023-03-08 ENCOUNTER — Telehealth: Payer: Self-pay | Admitting: *Deleted

## 2023-03-08 NOTE — Progress Notes (Signed)
Transition Care Management Unsuccessful Follow-up Telephone Call  Date of discharge and from where:  [facility]  02/18/2023  Attempts:  1st Attempt  Reason for unsuccessful TCM follow-up call:  No answer/busy

## 2023-03-12 DIAGNOSIS — M5416 Radiculopathy, lumbar region: Secondary | ICD-10-CM | POA: Diagnosis not present

## 2023-03-28 ENCOUNTER — Other Ambulatory Visit: Payer: Self-pay | Admitting: Family Medicine

## 2023-03-29 NOTE — Telephone Encounter (Signed)
Requested medication (s) are due for refill today: routing for review  Requested medication (s) are on the active medication list: yes  Last refill:  06/23/22  Future visit scheduled: yes  Notes to clinic:   Medication not assigned to a protocol, review manually      Requested Prescriptions  Pending Prescriptions Disp Refills   Testosterone 1.62 % GEL [Pharmacy Med Name: TESTOSTERONE 1.62% GEL PUMP] 75 g     Sig: PLACE 2 PUMPS ONTO THE SKIN DAILY     Off-Protocol Failed - 03/29/2023 10:14 AM      Failed - Medication not assigned to a protocol, review manually.      Failed - Valid encounter within last 12 months    Recent Outpatient Visits           1 year ago General medical exam   [facility] Donita Brooks, MD   2 years ago Esophageal dysphagia   [facility] Tanya Nones, Priscille Heidelberg, MD   3 years ago GAD (generalized anxiety disorder)   [facility] Donita Brooks, MD   4 years ago Elevated blood pressure reading   [facility] Pickard, Priscille Heidelberg, MD   5 years ago Family history of factor V Leiden mutation   [facility] Pickard, Priscille Heidelberg, MD       Future Appointments             In 3 months Pickard, Priscille Heidelberg, MD [facility], [facility]

## 2023-03-30 ENCOUNTER — Other Ambulatory Visit: Payer: Self-pay | Admitting: Family Medicine

## 2023-03-30 NOTE — Telephone Encounter (Signed)
Requested Prescriptions  Pending Prescriptions Disp Refills   pantoprazole (PROTONIX) 40 MG tablet [Pharmacy Med Name: PANTOPRAZOLE SOD DR 40 MG TAB] 90 tablet 0    Sig: TAKE 1 TABLET BY MOUTH DAILY     Gastroenterology: Proton Pump Inhibitors Failed - 03/30/2023  5:23 PM      Failed - Valid encounter within last 12 months    Recent Outpatient Visits           1 year ago General medical exam   [facility] Donita Brooks, MD   2 years ago Esophageal dysphagia   [facility] Donita Brooks, MD   3 years ago GAD (generalized anxiety disorder)   [facility] Donita Brooks, MD   4 years ago Elevated blood pressure reading   [facility] Pickard, Priscille Heidelberg, MD   5 years ago Family history of factor V Leiden mutation   [facility] Pickard, Priscille Heidelberg, MD       Future Appointments             In 3 months Pickard, Priscille Heidelberg, MD [facility], [facility]

## 2023-04-01 DIAGNOSIS — M791 Myalgia, unspecified site: Secondary | ICD-10-CM | POA: Diagnosis not present

## 2023-04-01 DIAGNOSIS — M51362 Other intervertebral disc degeneration, lumbar region with discogenic back pain and lower extremity pain: Secondary | ICD-10-CM | POA: Diagnosis not present

## 2023-04-01 DIAGNOSIS — M47896 Other spondylosis, lumbar region: Secondary | ICD-10-CM | POA: Diagnosis not present

## 2023-04-02 NOTE — Telephone Encounter (Signed)
Pharmacy sent script to follow up on refill requested for Testosterone 20.25 MG/ACT (1.62%) GEL   LOV: 06/22/22 (cpe)  Pharmacy:   [id number] - [place], [place] - [address] [place], [place] [address] Phone: [phone]  Fax: [phone]   Please advise pharmacist.

## 2023-06-09 DIAGNOSIS — M51362 Other intervertebral disc degeneration, lumbar region with discogenic back pain and lower extremity pain: Secondary | ICD-10-CM | POA: Diagnosis not present

## 2023-06-09 DIAGNOSIS — M791 Myalgia, unspecified site: Secondary | ICD-10-CM | POA: Diagnosis not present

## 2023-06-09 DIAGNOSIS — M47896 Other spondylosis, lumbar region: Secondary | ICD-10-CM | POA: Diagnosis not present

## 2023-06-20 ENCOUNTER — Other Ambulatory Visit: Payer: Self-pay | Admitting: Family Medicine

## 2023-06-20 DIAGNOSIS — F411 Generalized anxiety disorder: Secondary | ICD-10-CM

## 2023-06-21 ENCOUNTER — Telehealth: Payer: Self-pay

## 2023-06-21 NOTE — Telephone Encounter (Signed)
 Prescription Request  06/21/2023  LOV: 06/22/22 UPCOMING CPE 06/28/23  What is the name of the medication or equipment? pantoprazole (PROTONIX) 40 MG tablet [[id number]]   Have you contacted your pharmacy to request a refill? Yes   Which pharmacy would you like this sent to?  [facility] [id number] [place], [place] - [address] [place] [address] Phone: [phone] Fax: [phone]    Patient notified that their request is being sent to the clinical staff for review and that they should receive a response within 2 business days.   Please advise at [facility] [phone]

## 2023-06-21 NOTE — Telephone Encounter (Signed)
 Requested on 06/20/23 by pharmacist in separate refill encounter.

## 2023-06-22 NOTE — Telephone Encounter (Signed)
 Courtesy refill given, appointment needed.   Requested Prescriptions  Pending Prescriptions Disp Refills   pantoprazole (PROTONIX) 40 MG tablet [Pharmacy Med Name: PANTOPRAZOLE SOD DR 40 MG TAB] 30 tablet 0    Sig: Take 1 tablet (40 mg total) by mouth daily. OFFICE VISIT NEEDED FOR ADDITIONAL REFILLS     Gastroenterology: Proton Pump Inhibitors Failed - 06/22/2023  9:13 AM      Failed - Valid encounter within last 12 months    Recent Outpatient Visits           1 year ago General medical exam   [facility] Austine Lefort, MD   1 year ago DDD (degenerative disc disease), lumbosacral   [facility] Austine Lefort, MD   1 year ago TIA (transient ischemic attack)   [facility] Austine Lefort, MD       Future Appointments             In 6 days Austine Lefort, MD [facility], [facility]             escitalopram (LEXAPRO) 10 MG tablet [Pharmacy Med Name: ESCITALOPRAM 10 MG TABLET] 30 tablet 0    Sig: Take 1 tablet (10 mg total) by mouth daily. OFFICE VISIT NEEDED FOR ADDITIONAL REFILLS     Psychiatry:  Antidepressants - SSRI Failed - 06/22/2023  9:13 AM      Failed - Valid encounter within last 6 months    Recent Outpatient Visits           1 year ago General medical exam   [facility] Austine Lefort, MD   1 year ago DDD (degenerative disc disease), lumbosacral   [facility] Austine Lefort, MD   1 year ago TIA (transient ischemic attack)   [facility] Austine Lefort, MD       Future Appointments             In 6 days Pickard, Cisco Crest, MD [facility], [facility]

## 2023-06-24 ENCOUNTER — Other Ambulatory Visit: Payer: Self-pay | Admitting: Family Medicine

## 2023-06-24 DIAGNOSIS — R030 Elevated blood-pressure reading, without diagnosis of hypertension: Secondary | ICD-10-CM

## 2023-06-24 NOTE — Telephone Encounter (Signed)
 Requested medications are due for refill today.  yes  Requested medications are on the active medications list.  yes  Last refill. 06/23/2022 #90 3 rf  Future visit scheduled.   yes  Notes to clinic.  Labs are expired.    Requested Prescriptions  Pending Prescriptions Disp Refills   rosuvastatin (CRESTOR) 10 MG tablet [Pharmacy Med Name: ROSUVASTATIN CALCIUM 10 MG TAB] 90 tablet 3    Sig: TAKE 1 TABLET BY MOUTH DAILY     Cardiovascular:  Antilipid - Statins 2 Failed - 06/24/2023  5:27 PM      Failed - Valid encounter within last 12 months    Recent Outpatient Visits           1 year ago General medical exam   [facility] Austine Lefort, MD   1 year ago DDD (degenerative disc disease), lumbosacral   [facility] [facility] Austine Lefort, MD   1 year ago TIA (transient ischemic attack)   [facility] Pickard, Cisco Crest, MD       Future Appointments             In 4 days Cheril Cork Cisco Crest, MD  [facility], [facility]            Failed - Lipid Panel in normal range within the last 12 months    Cholesterol  Date Value Ref Range Status  06/22/2022 143 <200 mg/dL Final   LDL Cholesterol (Calc)  Date Value Ref Range Status  06/22/2022 50 mg/dL (calc) Final    Comment:    Reference range: <100 . Desirable range <100 mg/dL for primary prevention;   <70 mg/dL for patients with CHD or diabetic patients  with > or = 2 CHD risk factors. Aaron Aas LDL-C is now calculated using the Martin-Hopkins  calculation, which is a validated novel method providing  better accuracy than the Friedewald equation in the  estimation of LDL-C.  Melinda Sprawls et al. Erroll Heard. 9147;829(56): 2061-2068  (http://education.[employer].com/faq/FAQ164)    HDL  Date Value Ref Range Status  06/22/2022 69 > OR = 40 mg/dL Final   Triglycerides  Date Value Ref Range Status  06/22/2022 161 (H) <150 mg/dL  Final         Passed - Cr in normal range and within 360 days    Creat  Date Value Ref Range Status  06/22/2022 0.89 0.70 - 1.35 mg/dL Final   Creatinine, Ser  Date Value Ref Range Status  02/18/2023 1.11 0.61 - 1.24 mg/dL Final         Passed - Patient is not pregnant

## 2023-06-28 ENCOUNTER — Ambulatory Visit (INDEPENDENT_AMBULATORY_CARE_PROVIDER_SITE_OTHER): Payer: 59 | Admitting: Family Medicine

## 2023-06-28 ENCOUNTER — Encounter: Payer: Self-pay | Admitting: Family Medicine

## 2023-06-28 VITALS — BP 120/68 | HR 73 | Temp 97.8°F | Ht 68.0 in | Wt 193.0 lb

## 2023-06-28 DIAGNOSIS — E7800 Pure hypercholesterolemia, unspecified: Secondary | ICD-10-CM | POA: Diagnosis not present

## 2023-06-28 DIAGNOSIS — Z23 Encounter for immunization: Secondary | ICD-10-CM | POA: Diagnosis not present

## 2023-06-28 DIAGNOSIS — M67449 Ganglion, unspecified hand: Secondary | ICD-10-CM

## 2023-06-28 DIAGNOSIS — Z0001 Encounter for general adult medical examination with abnormal findings: Secondary | ICD-10-CM

## 2023-06-28 DIAGNOSIS — Z0000 Encounter for general adult medical examination without abnormal findings: Secondary | ICD-10-CM

## 2023-06-28 DIAGNOSIS — Z125 Encounter for screening for malignant neoplasm of prostate: Secondary | ICD-10-CM | POA: Diagnosis not present

## 2023-06-28 LAB — CBC WITH DIFFERENTIAL/PLATELET
Absolute Lymphocytes: 2211 {cells}/uL (ref 850–3900)
Absolute Monocytes: 599 {cells}/uL (ref 200–950)
Basophils Absolute: 69 {cells}/uL (ref 0–200)
Basophils Relative: 1.1 %
Eosinophils Absolute: 296 {cells}/uL (ref 15–500)
Eosinophils Relative: 4.7 %
HCT: 44.6 % (ref 38.5–50.0)
Hemoglobin: 14.5 g/dL (ref 13.2–17.1)
MCH: 30.5 pg (ref 27.0–33.0)
MCHC: 32.5 g/dL (ref 32.0–36.0)
MCV: 93.9 fL (ref 80.0–100.0)
MPV: 10.5 fL (ref 7.5–12.5)
Monocytes Relative: 9.5 %
Neutro Abs: 3125 {cells}/uL (ref 1500–7800)
Neutrophils Relative %: 49.6 %
Platelets: 276 10*3/uL (ref 140–400)
RBC: 4.75 10*6/uL (ref 4.20–5.80)
RDW: 11.2 % (ref 11.0–15.0)
Total Lymphocyte: 35.1 %
WBC: 6.3 10*3/uL (ref 3.8–10.8)

## 2023-06-28 LAB — COMPLETE METABOLIC PANEL WITHOUT GFR
AG Ratio: 1.7 (calc) (ref 1.0–2.5)
ALT: 26 U/L (ref 9–46)
AST: 22 U/L (ref 10–35)
Albumin: 4.2 g/dL (ref 3.6–5.1)
Alkaline phosphatase (APISO): 59 U/L (ref 35–144)
BUN: 11 mg/dL (ref 7–25)
CO2: 26 mmol/L (ref 20–32)
Calcium: 9.5 mg/dL (ref 8.6–10.3)
Chloride: 105 mmol/L (ref 98–110)
Creat: 0.93 mg/dL (ref 0.70–1.35)
Globulin: 2.5 g/dL (ref 1.9–3.7)
Glucose, Bld: 92 mg/dL (ref 65–99)
Potassium: 4.4 mmol/L (ref 3.5–5.3)
Sodium: 141 mmol/L (ref 135–146)
Total Bilirubin: 0.6 mg/dL (ref 0.2–1.2)
Total Protein: 6.7 g/dL (ref 6.1–8.1)

## 2023-06-28 LAB — LIPID PANEL
Cholesterol: 147 mg/dL (ref <200)
HDL: 62 mg/dL (ref >=40)
LDL Cholesterol (Calc): 56 mg/dL
Non-HDL Cholesterol (Calc): 85 mg/dL (ref <130)
Total CHOL/HDL Ratio: 2.4 (calc) (ref <5.0)
Triglycerides: 219 mg/dL — ABNORMAL HIGH (ref <150)

## 2023-06-28 LAB — PSA: PSA: 0.37 ng/mL (ref <=4.00)

## 2023-06-28 NOTE — Addendum Note (Signed)
 Addended by: Gillermo Lack K on: 06/28/2023 12:55 PM   Modules accepted: Orders

## 2023-06-28 NOTE — Progress Notes (Signed)
 Subjective:    Patient ID: Casey Stephens, male    DOB: 1960-12-11, 63 y.o.   MRN: [id number] Patient is a very pleasant 63 year old Caucasian gentleman here today for complete physical exam.  His blood pressure today is excellent.  His last colonoscopy was in 2023.  Due to the presence of 1 tubular adenoma they recommended a repeat colonoscopy in 2030.  He is due for a PSA today.  Immunizations are up-to-date except for the pneumonia vaccine.  He would like to receive Prevnar 20 today.  His review of systems is negative other than a deformity in his right thumbnail.  There appears to be a cystlike mass near the nail matrix causing the nail to grow longitudinally this form.  I believe it is a subungual digital mucous cyst.  It has been present for 6 or more months.  He denies any pain. Past Medical History:  Diagnosis Date   Allergy    SEASONAL   Anxiety    Arthritis    BACK,NECK,HANDS   GERD (gastroesophageal reflux disease)    Hyperlipidemia    Hypertension    Past Surgical History:  Procedure Laterality Date   av fistula lt temple     ruptured artery from softball injury   COLONOSCOPY     crushed thumb repair     POLYPECTOMY     testicular biopsy     negative   Current Outpatient Medications on File Prior to Visit  Medication Sig Dispense Refill   ALPRAZolam  (XANAX ) 0.5 MG tablet TAKE 1 TABLET BY MOUTH 3 TIMES A DAY AS NEEDED FOR ANXIETY 30 tablet 1   aspirin 81 MG tablet Take 81 mg by mouth daily.     cetirizine (ZYRTEC) 10 MG tablet Take 10 mg by mouth as needed for allergies.     escitalopram  (LEXAPRO ) 10 MG tablet Take 1 tablet (10 mg total) by mouth daily. 90 tablet 1   fish oil-omega-3 fatty acids 1000 MG capsule Take 2 g by mouth daily.     meloxicam  (MOBIC ) 15 MG tablet TAKE 1 TABLET BY MOUTH DAILY 90 tablet 3   oxyCODONE  (ROXICODONE ) 5 MG immediate release tablet Take 1 tablet (5 mg total) by mouth every 8 (eight) hours as needed for severe pain (pain score 7-10) or  breakthrough pain. 20 tablet 0   pantoprazole  (PROTONIX ) 40 MG tablet Take 1 tablet (40 mg total) by mouth daily. OFFICE VISIT NEEDED FOR ADDITIONAL REFILLS 30 tablet 0   rosuvastatin  (CRESTOR ) 10 MG tablet TAKE 1 TABLET BY MOUTH DAILY 90 tablet 3   Testosterone  1.62 % GEL PLACE 2 PUMPS ONTO THE SKIN DAILY 75 g 0   valsartan  (DIOVAN ) 160 MG tablet Take 1 tablet (160 mg total) by mouth daily. 90 tablet 3   No current facility-administered medications on file prior to visit.   No Known Allergies Social History   Socioeconomic History   Marital status: Divorced    Spouse name: Not on file   Number of children: Not on file   Years of education: Not on file   Highest education level: Not on file  Occupational History   Not on file  Tobacco Use   Smoking status: Former    Current packs/day: 0.00    Average packs/day: 0.5 packs/day for 25.0 years (12.5 ttl pk-yrs)    Types: Cigarettes    Start date: 01/30/1988    Quit date: 01/29/2013    Years since quitting: 10.4   Smokeless tobacco: Never  Vaping  Use   Vaping status: Some Days   Substances: CBD  Substance and Sexual Activity   Alcohol use: Yes    Alcohol/week: 12.0 standard drinks of alcohol    Types: 12 Cans of beer per week    Comment: 1-2 BEER DAILY   Drug use: No   Sexual activity: Yes    Birth control/protection: None  Other Topics Concern   Not on file  Social History Narrative   Not on file   Social Drivers of Health   Financial Resource Strain: Not on file  Food Insecurity: Not on file  Transportation Needs: Not on file  Physical Activity: Not on file  Stress: Not on file  Social Connections: Not on file  Intimate Partner Violence: Not on file      Review of Systems  All other systems reviewed and are negative.      Objective:   Physical Exam Vitals reviewed.  Constitutional:      General: He is not in acute distress.    Appearance: He is well-developed. He is not diaphoretic.  HENT:     Head:  Normocephalic and atraumatic.     Right Ear: External ear normal.     Left Ear: External ear normal.     Nose: Nose normal.     Mouth/Throat:     Pharynx: No oropharyngeal exudate.  Eyes:     General: No scleral icterus.       Right eye: No discharge.        Left eye: No discharge.     Conjunctiva/sclera: Conjunctivae normal.     Pupils: Pupils are equal, round, and reactive to light.  Neck:     Thyroid: No thyromegaly.     Vascular: No JVD.     Trachea: No tracheal deviation.  Cardiovascular:     Rate and Rhythm: Normal rate and regular rhythm.     Heart sounds: Normal heart sounds. No murmur heard.    No friction rub. No gallop.  Pulmonary:     Effort: Pulmonary effort is normal. No respiratory distress.     Breath sounds: Normal breath sounds. No stridor. No wheezing or rales.  Chest:     Chest wall: No tenderness.  Abdominal:     General: Bowel sounds are normal. There is no distension.     Palpations: Abdomen is soft. There is no mass.     Tenderness: There is no abdominal tenderness. There is no guarding or rebound.  Genitourinary:    Penis: Normal.      Testes:        Right: Tenderness present. Testicular hydrocele or varicocele not present.        Left: Tenderness present. Testicular hydrocele or varicocele not present.     Prostate: Normal. Not enlarged and not tender.     Rectum: Normal.  Musculoskeletal:        General: No tenderness.     Cervical back: Normal range of motion and neck supple.  Lymphadenopathy:     Cervical: No cervical adenopathy.  Skin:    General: Skin is warm.     Coloration: Skin is not pale.     Findings: No erythema or rash.  Neurological:     Mental Status: He is alert and oriented to person, place, and time.     Cranial Nerves: No cranial nerve deficit.     Motor: No abnormal muscle tone.     Coordination: Coordination normal.     Deep Tendon Reflexes: Reflexes  normal.  Psychiatric:        Behavior: Behavior normal.         Thought Content: Thought content normal.        Judgment: Judgment normal.           Assessment & Plan:   Digital mucous cyst of finger - Plan: Ambulatory referral to Hand Surgery  Prostate cancer screening - Plan: PSA  Pure hypercholesterolemia - Plan: CBC with Differential/Platelet, COMPLETE METABOLIC PANEL WITHOUT GFR, Lipid panel  General medical exam I believe the patient has a subungual digital mucous cyst.  Consult hand surgery to remove the nail and remove the cystlike mass.  Check a PSA to screen for prostate cancer.  Colonoscopy is up-to-date until 2030.  Check CBC CMP and lipid panel.  Goal LDL cholesterol is less than 70 given previous history of TIA.  Patient received Prevnar 20 today in the office

## 2023-07-02 ENCOUNTER — Encounter: Payer: Self-pay | Admitting: Family Medicine

## 2023-07-02 ENCOUNTER — Other Ambulatory Visit: Payer: Self-pay | Admitting: Family Medicine

## 2023-07-02 DIAGNOSIS — M67449 Ganglion, unspecified hand: Secondary | ICD-10-CM

## 2023-07-05 DIAGNOSIS — F325 Major depressive disorder, single episode, in full remission: Secondary | ICD-10-CM | POA: Diagnosis not present

## 2023-07-05 DIAGNOSIS — Z791 Long term (current) use of non-steroidal anti-inflammatories (NSAID): Secondary | ICD-10-CM | POA: Diagnosis not present

## 2023-07-05 DIAGNOSIS — M1990 Unspecified osteoarthritis, unspecified site: Secondary | ICD-10-CM | POA: Diagnosis not present

## 2023-07-05 DIAGNOSIS — M545 Low back pain, unspecified: Secondary | ICD-10-CM | POA: Diagnosis not present

## 2023-07-05 DIAGNOSIS — Z8673 Personal history of transient ischemic attack (TIA), and cerebral infarction without residual deficits: Secondary | ICD-10-CM | POA: Diagnosis not present

## 2023-07-05 DIAGNOSIS — I10 Essential (primary) hypertension: Secondary | ICD-10-CM | POA: Diagnosis not present

## 2023-07-05 DIAGNOSIS — Z8249 Family history of ischemic heart disease and other diseases of the circulatory system: Secondary | ICD-10-CM | POA: Diagnosis not present

## 2023-07-05 DIAGNOSIS — E785 Hyperlipidemia, unspecified: Secondary | ICD-10-CM | POA: Diagnosis not present

## 2023-07-05 DIAGNOSIS — Z87891 Personal history of nicotine dependence: Secondary | ICD-10-CM | POA: Diagnosis not present

## 2023-07-05 DIAGNOSIS — F419 Anxiety disorder, unspecified: Secondary | ICD-10-CM | POA: Diagnosis not present

## 2023-07-05 DIAGNOSIS — Z7982 Long term (current) use of aspirin: Secondary | ICD-10-CM | POA: Diagnosis not present

## 2023-07-05 DIAGNOSIS — Z833 Family history of diabetes mellitus: Secondary | ICD-10-CM | POA: Diagnosis not present

## 2023-07-23 DIAGNOSIS — M5416 Radiculopathy, lumbar region: Secondary | ICD-10-CM | POA: Diagnosis not present

## 2023-08-01 ENCOUNTER — Other Ambulatory Visit: Payer: Self-pay | Admitting: Family Medicine

## 2023-08-01 DIAGNOSIS — M51379 Other intervertebral disc degeneration, lumbosacral region without mention of lumbar back pain or lower extremity pain: Secondary | ICD-10-CM

## 2023-08-03 DIAGNOSIS — M67441 Ganglion, right hand: Secondary | ICD-10-CM | POA: Diagnosis not present

## 2023-08-04 DIAGNOSIS — M67449 Ganglion, unspecified hand: Secondary | ICD-10-CM | POA: Insufficient documentation

## 2023-08-27 DIAGNOSIS — M67449 Ganglion, unspecified hand: Secondary | ICD-10-CM | POA: Diagnosis not present

## 2023-08-27 DIAGNOSIS — M71341 Other bursal cyst, right hand: Secondary | ICD-10-CM | POA: Diagnosis not present

## 2023-08-27 DIAGNOSIS — M24041 Loose body in right finger joint(s): Secondary | ICD-10-CM | POA: Diagnosis not present

## 2023-08-27 DIAGNOSIS — M25741 Osteophyte, right hand: Secondary | ICD-10-CM | POA: Diagnosis not present

## 2023-08-27 DIAGNOSIS — M67441 Ganglion, right hand: Secondary | ICD-10-CM | POA: Diagnosis not present

## 2023-09-01 DIAGNOSIS — M47896 Other spondylosis, lumbar region: Secondary | ICD-10-CM | POA: Diagnosis not present

## 2023-09-01 DIAGNOSIS — M542 Cervicalgia: Secondary | ICD-10-CM | POA: Diagnosis not present

## 2023-09-01 DIAGNOSIS — M51362 Other intervertebral disc degeneration, lumbar region with discogenic back pain and lower extremity pain: Secondary | ICD-10-CM | POA: Diagnosis not present

## 2023-09-01 DIAGNOSIS — M47812 Spondylosis without myelopathy or radiculopathy, cervical region: Secondary | ICD-10-CM | POA: Insufficient documentation

## 2023-09-01 DIAGNOSIS — M791 Myalgia, unspecified site: Secondary | ICD-10-CM | POA: Diagnosis not present

## 2023-09-01 DIAGNOSIS — M5416 Radiculopathy, lumbar region: Secondary | ICD-10-CM | POA: Diagnosis not present

## 2023-09-04 ENCOUNTER — Other Ambulatory Visit: Payer: Self-pay | Admitting: Family Medicine

## 2023-09-07 DIAGNOSIS — M79644 Pain in right finger(s): Secondary | ICD-10-CM | POA: Diagnosis not present

## 2023-09-07 DIAGNOSIS — M6780 Other specified disorders of synovium and tendon, unspecified site: Secondary | ICD-10-CM | POA: Diagnosis not present

## 2023-09-16 DIAGNOSIS — M542 Cervicalgia: Secondary | ICD-10-CM | POA: Insufficient documentation

## 2023-09-27 ENCOUNTER — Other Ambulatory Visit: Payer: Self-pay | Admitting: Family Medicine

## 2023-09-29 NOTE — Telephone Encounter (Signed)
 Requested Prescriptions  Pending Prescriptions Disp Refills   pantoprazole  (PROTONIX ) 40 MG tablet [Pharmacy Med Name: PANTOPRAZOLE  SOD DR 40 MG TAB] 90 tablet 3    Sig: TAKE 1 TABLET BY MOUTH DAILY *OFFICE VISIT NEEDED FOR ADDITIONAL REFILLS*     Gastroenterology: Proton Pump Inhibitors Passed - 09/29/2023  3:39 PM      Passed - Valid encounter within last 12 months    Recent Outpatient Visits           3 months ago Digital mucous cyst of finger   [place] [facility] Pickard, Butler DASEN, MD   1 year ago General medical exam   [place] [facility] Duanne Butler DASEN, MD   1 year ago DDD (degenerative disc disease), lumbosacral   [facility] Duanne Butler DASEN, MD   1 year ago TIA (transient ischemic attack)   [facility] Pickard, Butler DASEN, MD

## 2023-10-27 DIAGNOSIS — M5416 Radiculopathy, lumbar region: Secondary | ICD-10-CM | POA: Diagnosis not present

## 2023-11-17 DIAGNOSIS — M47812 Spondylosis without myelopathy or radiculopathy, cervical region: Secondary | ICD-10-CM | POA: Diagnosis not present

## 2023-11-17 DIAGNOSIS — M51362 Other intervertebral disc degeneration, lumbar region with discogenic back pain and lower extremity pain: Secondary | ICD-10-CM | POA: Diagnosis not present

## 2023-11-17 DIAGNOSIS — M791 Myalgia, unspecified site: Secondary | ICD-10-CM | POA: Diagnosis not present

## 2023-11-17 DIAGNOSIS — M47896 Other spondylosis, lumbar region: Secondary | ICD-10-CM | POA: Diagnosis not present

## 2023-11-29 ENCOUNTER — Other Ambulatory Visit: Payer: Self-pay | Admitting: Family Medicine

## 2023-11-29 DIAGNOSIS — F411 Generalized anxiety disorder: Secondary | ICD-10-CM

## 2023-11-30 NOTE — Telephone Encounter (Signed)
 Requested medication (s) are due for refill today: na   Requested medication (s) are on the active medication list: yes   Last refill:  xanax - 02/22/23 #30 1 refills, lexapro - 01/21/23 #90 1 refills  Future visit scheduled: yes 06/29/24  Notes to clinic:  not delegated per protocol- xanax , do you want to refill Rx lexapro   until next OV 06/29/24?     Requested Prescriptions  Pending Prescriptions Disp Refills   ALPRAZolam  (XANAX ) 0.5 MG tablet [Pharmacy Med Name: ALPRAZolam  0.5 MG TABLET] 30 tablet     Sig: TAKE 1 TABLET BY MOUTH 3 TIMES A DAY AS NEEDED FOR ANXIETY     Not Delegated - Psychiatry: Anxiolytics/Hypnotics 2 Failed - 11/30/2023  2:14 PM      Failed - This refill cannot be delegated      Failed - Urine Drug Screen completed in last 360 days      Passed - Patient is not pregnant      Passed - Valid encounter within last 6 months    Recent Outpatient Visits           5 months ago Digital mucous cyst of finger   [place] [facility] Pickard, Butler DASEN, MD   1 year ago General medical exam   [place] [facility] Duanne Butler DASEN, MD   1 year ago DDD (degenerative disc disease), lumbosacral   [place] [facility] Duanne Butler DASEN, MD   2 years ago TIA (transient ischemic attack)   [place] [facility] Pickard, Butler DASEN, MD               escitalopram  (LEXAPRO ) 10 MG tablet [Pharmacy Med Name: ESCITALOPRAM  10 MG TABLET] 30 tablet     Sig: TAKE 1 TABLET BY MOUTH DAILY *OFFICE VISIT REQUIRED FOR FURTHER REFILLS*     Psychiatry:  Antidepressants - SSRI Passed - 11/30/2023  2:14 PM      Passed - Valid encounter within last 6 months    Recent Outpatient Visits           5 months ago Digital mucous cyst of finger   [place] [facility] Pickard, Butler DASEN, MD   1 year ago General medical exam   [place] [facility] Duanne Butler DASEN, MD   1 year ago  DDD (degenerative disc disease), lumbosacral   [place] [facility] Duanne Butler DASEN, MD   2 years ago TIA (transient ischemic attack)   [place] [facility] Pickard, Butler DASEN, MD

## 2023-12-01 ENCOUNTER — Other Ambulatory Visit: Payer: Self-pay

## 2023-12-01 ENCOUNTER — Encounter: Payer: Self-pay | Admitting: Family Medicine

## 2023-12-01 DIAGNOSIS — F411 Generalized anxiety disorder: Secondary | ICD-10-CM

## 2023-12-01 MED ORDER — ESCITALOPRAM OXALATE 10 MG PO TABS: 10.0000 mg | ORAL_TABLET | Freq: Every day | ORAL | 1 refills | Status: DC

## 2023-12-01 MED ORDER — ESCITALOPRAM OXALATE 10 MG PO TABS: 10.0000 mg | ORAL_TABLET | Freq: Every day | ORAL | 1 refills | Status: AC

## 2023-12-01 MED ORDER — ALPRAZOLAM 0.5 MG PO TABS: 0.5000 mg | ORAL_TABLET | Freq: Three times a day (TID) | ORAL | 1 refills | Status: DC | PRN

## 2023-12-02 MED ORDER — ALPRAZOLAM 0.5 MG PO TABS: 0.5000 mg | ORAL_TABLET | Freq: Three times a day (TID) | ORAL | 1 refills | Status: AC | PRN

## 2023-12-14 ENCOUNTER — Other Ambulatory Visit: Payer: Self-pay | Admitting: Family Medicine

## 2024-02-16 DIAGNOSIS — M47812 Spondylosis without myelopathy or radiculopathy, cervical region: Secondary | ICD-10-CM | POA: Diagnosis not present

## 2024-02-16 DIAGNOSIS — M47896 Other spondylosis, lumbar region: Secondary | ICD-10-CM | POA: Diagnosis not present

## 2024-02-16 DIAGNOSIS — M791 Myalgia, unspecified site: Secondary | ICD-10-CM | POA: Diagnosis not present

## 2024-02-16 DIAGNOSIS — M51362 Other intervertebral disc degeneration, lumbar region with discogenic back pain and lower extremity pain: Secondary | ICD-10-CM | POA: Diagnosis not present

## 2024-02-25 DIAGNOSIS — M5416 Radiculopathy, lumbar region: Secondary | ICD-10-CM | POA: Diagnosis not present

## 2024-03-30 ENCOUNTER — Ambulatory Visit: Admitting: Family Medicine

## 2024-03-30 ENCOUNTER — Encounter: Payer: Self-pay | Admitting: Family Medicine

## 2024-03-30 VITALS — BP 110/66 | HR 72 | Temp 97.7°F | Ht 68.0 in | Wt 185.6 lb

## 2024-03-30 DIAGNOSIS — N401 Enlarged prostate with lower urinary tract symptoms: Secondary | ICD-10-CM

## 2024-03-30 DIAGNOSIS — R350 Frequency of micturition: Secondary | ICD-10-CM | POA: Diagnosis not present

## 2024-03-30 LAB — URINALYSIS, ROUTINE W REFLEX MICROSCOPIC
Bacteria, UA: NONE SEEN /HPF
Bilirubin Urine: NEGATIVE
Glucose, UA: NEGATIVE
Hyaline Cast: NONE SEEN /LPF
Ketones, ur: NEGATIVE
Leukocytes,Ua: NEGATIVE
Nitrite: NEGATIVE
Protein, ur: NEGATIVE
Specific Gravity, Urine: 1.02 (ref 1.001–1.035)
Squamous Epithelial / HPF: NONE SEEN /HPF
WBC, UA: NONE SEEN /HPF (ref 0–5)
pH: 7.5 (ref 5.0–8.0)

## 2024-03-30 LAB — MICROSCOPIC MESSAGE

## 2024-03-30 MED ORDER — TAMSULOSIN HCL 0.4 MG PO CAPS: 0.4000 mg | ORAL_CAPSULE | Freq: Every day | ORAL | 3 refills | Status: AC

## 2024-03-30 NOTE — Progress Notes (Signed)
 "  Subjective:    Patient ID: Casey Stephens, male    DOB: Jan 30, 1961, 64 y.o.   MRN: [id number] Patient states that for the last year he has been having increasing urinary frequency.  He states that he feels like he has to go to the bathroom however when he goes he has a weak stream.  He does not feel like he is completely evacuating his bladder.  He also has some mild burning with urination occasionally.  He does have increased urinary frequency however again it is a weak stream.  He denies any hematuria.  He denies any back pain or flank pain.  Urinalysis shows trace blood but is otherwise normal.  He does report hesitancy with urination Past Medical History:  Diagnosis Date   Allergy    SEASONAL   Anxiety    Arthritis    BACK,NECK,HANDS   GERD (gastroesophageal reflux disease)    Hyperlipidemia    Hypertension    Past Surgical History:  Procedure Laterality Date   av fistula lt temple     ruptured artery from softball injury   COLONOSCOPY     crushed thumb repair     POLYPECTOMY     testicular biopsy     negative   Current Outpatient Medications on File Prior to Visit  Medication Sig Dispense Refill   ALPRAZolam  (XANAX ) 0.5 MG tablet Take 1 tablet (0.5 mg total) by mouth 3 (three) times daily as needed for anxiety. 30 tablet 1   aspirin 81 MG tablet Take 81 mg by mouth daily.     cetirizine (ZYRTEC) 10 MG tablet Take 10 mg by mouth as needed for allergies.     escitalopram  (LEXAPRO ) 10 MG tablet Take 1 tablet (10 mg total) by mouth daily. 90 tablet 1   fish oil-omega-3 fatty acids 1000 MG capsule Take 2 g by mouth daily.     meloxicam  (MOBIC ) 15 MG tablet TAKE 1 TABLET BY MOUTH DAILY 30 tablet 1   pantoprazole  (PROTONIX ) 40 MG tablet TAKE 1 TABLET BY MOUTH DAILY *OFFICE VISIT NEEDED FOR ADDITIONAL REFILLS* 90 tablet 3   rosuvastatin  (CRESTOR ) 10 MG tablet TAKE 1 TABLET BY MOUTH DAILY 90 tablet 3   Testosterone  1.62 % GEL PLACE 2 PUMPS ONTO THE SKIN DAILY 75 g 0   valsartan   (DIOVAN ) 160 MG tablet TAKE 1 TABLET BY MOUTH DAILY 90 tablet 3   No current facility-administered medications on file prior to visit.   No Known Allergies Social History   Socioeconomic History   Marital status: Divorced    Spouse name: Not on file   Number of children: Not on file   Years of education: Not on file   Highest education level: Some college, no degree  Occupational History   Not on file  Tobacco Use   Smoking status: Former    Current packs/day: 0.00    Average packs/day: 0.5 packs/day for 25.0 years (12.5 ttl pk-yrs)    Types: Cigarettes    Start date: 01/30/1988    Quit date: 01/29/2013    Years since quitting: 11.1   Smokeless tobacco: Never  Vaping Use   Vaping status: Some Days   Substances: CBD  Substance and Sexual Activity   Alcohol use: Yes    Alcohol/week: 12.0 standard drinks of alcohol    Types: 12 Cans of beer per week    Comment: 1-2 BEER DAILY   Drug use: No   Sexual activity: Yes    Birth control/protection: None  Other Topics Concern   Not on file  Social History Narrative   Not on file   Social Drivers of Health   Tobacco Use: Medium Risk (03/30/2024)   Patient History    Smoking Tobacco Use: Former    Smokeless Tobacco Use: Never    Passive Exposure: Not on file  Financial Resource Strain: Low Risk (03/29/2024)   Overall Financial Resource Strain (CARDIA)    Difficulty of Paying Living Expenses: Not very hard  Food Insecurity: No Food Insecurity (03/29/2024)   Epic    Worried About Radiation Protection Practitioner of Food in the Last Year: Never true    Ran Out of Food in the Last Year: Never true  Transportation Needs: No Transportation Needs (03/29/2024)   Epic    Lack of Transportation (Medical): No    Lack of Transportation (Non-Medical): No  Physical Activity: Insufficiently Active (03/29/2024)   Exercise Vital Sign    Days of Exercise per Week: 1 day    Minutes of Exercise per Session: 20 min  Stress: Stress Concern Present (03/29/2024)    [employer] of Occupational Health - Occupational Stress Questionnaire    Feeling of Stress: To some extent  Social Connections: Moderately Isolated (03/29/2024)   Social Connection and Isolation Panel    Frequency of Communication with Friends and Family: More than three times a week    Frequency of Social Gatherings with Friends and Family: Twice a week    Attends Religious Services: More than 4 times per year    Active Member of [employer] or Organizations: No    Attends Engineer, Structural: Not on file    Marital Status: Divorced  Intimate Partner Violence: Not on file  Depression (PHQ2-9): Low Risk (06/28/2023)   Depression (PHQ2-9)    PHQ-2 Score: 0  Alcohol Screen: Low Risk (03/29/2024)   Alcohol Screen    Last Alcohol Screening Score (AUDIT): 4  Housing: Unknown (03/29/2024)   Epic    Unable to Pay for Housing in the Last Year: Patient declined    Number of Times Moved in the Last Year: Not on file    Homeless in the Last Year: No  Utilities: Not on file  Health Literacy: Not on file      Review of Systems  All other systems reviewed and are negative.      Objective:   Physical Exam Vitals reviewed.  Constitutional:      General: He is not in acute distress.    Appearance: He is well-developed. He is not diaphoretic.  HENT:     Head: Normocephalic and atraumatic.     Right Ear: External ear normal.     Left Ear: External ear normal.     Nose: Nose normal.     Mouth/Throat:     Pharynx: No oropharyngeal exudate.  Eyes:     General: No scleral icterus.       Right eye: No discharge.        Left eye: No discharge.     Conjunctiva/sclera: Conjunctivae normal.     Pupils: Pupils are equal, round, and reactive to light.  Neck:     Thyroid: No thyromegaly.     Vascular: No JVD.     Trachea: No tracheal deviation.  Cardiovascular:     Rate and Rhythm: Normal rate and regular rhythm.     Heart sounds: Normal heart sounds. No murmur heard.    No  friction rub. No gallop.  Pulmonary:     Effort:  Pulmonary effort is normal. No respiratory distress.     Breath sounds: Normal breath sounds. No stridor. No wheezing or rales.  Chest:     Chest wall: No tenderness.  Abdominal:     General: Bowel sounds are normal. There is no distension.     Palpations: Abdomen is soft. There is no mass.     Tenderness: There is no abdominal tenderness. There is no guarding or rebound.  Genitourinary:    Penis: Normal.      Testes:        Right: Tenderness present. Testicular hydrocele or varicocele not present.        Left: Tenderness present. Testicular hydrocele or varicocele not present.     Prostate: Normal. Not enlarged and not tender.     Rectum: Normal.  Musculoskeletal:        General: No tenderness.     Cervical back: Normal range of motion and neck supple.  Lymphadenopathy:     Cervical: No cervical adenopathy.  Skin:    General: Skin is warm.     Coloration: Skin is not pale.     Findings: No erythema or rash.  Neurological:     Mental Status: He is alert and oriented to person, place, and time.     Cranial Nerves: No cranial nerve deficit.     Motor: No abnormal muscle tone.     Coordination: Coordination normal.     Deep Tendon Reflexes: Reflexes normal.  Psychiatric:        Behavior: Behavior normal.        Thought Content: Thought content normal.        Judgment: Judgment normal.           Assessment & Plan:   Urinary frequency - Plan: Urinalysis, Routine w reflex microscopic  Benign localized prostatic hyperplasia with lower urinary tract symptoms (LUTS) Patient has a history of an enlarged prostate.  I believe the symptoms are consistent with LUTS secondary to BPH.  Will try Flomax  0.4 mg p.o. nightly.  Reassess in 2 weeks to see if symptoms are improving, repeat urinalysis to see if hematuria is persistent in 2 weeks "

## 2024-04-03 ENCOUNTER — Ambulatory Visit: Admitting: Family Medicine

## 2024-04-11 ENCOUNTER — Other Ambulatory Visit

## 2024-06-29 ENCOUNTER — Encounter: Admitting: Family Medicine
